# Patient Record
Sex: Male | Born: 1970 | Race: White | Hispanic: No | Marital: Married | Smoking: Current every day smoker
Health system: Southern US, Community
[De-identification: ages and names within clinical notes are randomized; demographics above are authoritative.]

## PROBLEM LIST (undated history)

## (undated) DIAGNOSIS — I10 Essential (primary) hypertension: Secondary | ICD-10-CM

## (undated) DIAGNOSIS — E119 Type 2 diabetes mellitus without complications: Secondary | ICD-10-CM

## (undated) DIAGNOSIS — E785 Hyperlipidemia, unspecified: Secondary | ICD-10-CM

## (undated) HISTORY — PX: CHOLECYSTECTOMY: SHX55

---

## 2004-09-22 ENCOUNTER — Emergency Department: Payer: Self-pay | Admitting: Emergency Medicine

## 2005-09-16 ENCOUNTER — Emergency Department: Payer: Self-pay | Admitting: Unknown Physician Specialty

## 2005-09-20 ENCOUNTER — Inpatient Hospital Stay: Payer: Self-pay | Admitting: Internal Medicine

## 2006-11-10 ENCOUNTER — Other Ambulatory Visit: Payer: Self-pay

## 2006-11-10 ENCOUNTER — Emergency Department: Payer: Self-pay | Admitting: Emergency Medicine

## 2009-02-18 ENCOUNTER — Emergency Department: Payer: Self-pay | Admitting: Emergency Medicine

## 2009-11-30 ENCOUNTER — Emergency Department: Payer: Self-pay | Admitting: Emergency Medicine

## 2009-12-12 ENCOUNTER — Emergency Department: Payer: Self-pay | Admitting: Emergency Medicine

## 2010-09-28 ENCOUNTER — Emergency Department: Payer: Self-pay | Admitting: Emergency Medicine

## 2012-04-02 ENCOUNTER — Emergency Department: Payer: Self-pay | Admitting: Emergency Medicine

## 2012-04-02 LAB — BASIC METABOLIC PANEL
Calcium, Total: 9.2 mg/dL (ref 8.5–10.1)
Creatinine: 1.31 mg/dL — ABNORMAL HIGH (ref 0.60–1.30)
EGFR (African American): >60
EGFR (Non-African Amer.): >60
Glucose: 133 mg/dL — ABNORMAL HIGH (ref 65–99)
Osmolality: 281 (ref 275–301)
Potassium: 4.3 mmol/L (ref 3.5–5.1)
Sodium: 140 mmol/L (ref 136–145)

## 2012-04-02 LAB — CK TOTAL AND CKMB (NOT AT ARMC)
CK, Total: 103 U/L (ref 35–232)
CK-MB: 0.8 ng/mL (ref 0.5–3.6)

## 2012-04-02 LAB — CBC
MCH: 31.5 pg (ref 26.0–34.0)
MCHC: 34.4 g/dL (ref 32.0–36.0)
MCV: 91 fL (ref 80–100)
Platelet: 271 10*3/uL (ref 150–440)
RBC: 5.26 10*6/uL (ref 4.40–5.90)

## 2012-04-02 LAB — TROPONIN I: Troponin-I: <0.02 ng/mL

## 2012-06-10 ENCOUNTER — Emergency Department: Payer: Self-pay | Admitting: *Deleted

## 2012-06-10 LAB — TROPONIN I: Troponin-I: <0.02 ng/mL

## 2012-06-10 LAB — COMPREHENSIVE METABOLIC PANEL
Albumin: 3.4 g/dL (ref 3.4–5.0)
Alkaline Phosphatase: 62 U/L (ref 50–136)
Calcium, Total: 9.1 mg/dL (ref 8.5–10.1)
EGFR (African American): >60
Glucose: 115 mg/dL — ABNORMAL HIGH (ref 65–99)
SGOT(AST): 27 U/L (ref 15–37)
SGPT (ALT): 26 U/L (ref 12–78)

## 2012-06-10 LAB — CBC
HGB: 16.9 g/dL (ref 13.0–18.0)
MCHC: 33.8 g/dL (ref 32.0–36.0)
Platelet: 226 10*3/uL (ref 150–440)
RBC: 5.39 10*6/uL (ref 4.40–5.90)

## 2012-06-10 LAB — CK TOTAL AND CKMB (NOT AT ARMC): CK, Total: 243 U/L — ABNORMAL HIGH (ref 35–232)

## 2013-02-02 ENCOUNTER — Emergency Department: Payer: Self-pay | Admitting: Emergency Medicine

## 2013-02-02 LAB — CBC
HGB: 16.8 g/dL (ref 13.0–18.0)
MCH: 32.2 pg (ref 26.0–34.0)
MCHC: 34.3 g/dL (ref 32.0–36.0)
MCV: 94 fL (ref 80–100)
RBC: 5.24 10*6/uL (ref 4.40–5.90)
RDW: 14 % (ref 11.5–14.5)
WBC: 8.7 10*3/uL (ref 3.8–10.6)

## 2013-02-02 LAB — COMPREHENSIVE METABOLIC PANEL
Anion Gap: 8 (ref 7–16)
BUN: 5 mg/dL — ABNORMAL LOW (ref 7–18)
Calcium, Total: 8.5 mg/dL (ref 8.5–10.1)
Chloride: 109 mmol/L — ABNORMAL HIGH (ref 98–107)
Co2: 28 mmol/L (ref 21–32)
Creatinine: 1.2 mg/dL (ref 0.60–1.30)
EGFR (African American): >60
Osmolality: 288 (ref 275–301)
Potassium: 3.6 mmol/L (ref 3.5–5.1)
SGOT(AST): 125 U/L — ABNORMAL HIGH (ref 15–37)
SGPT (ALT): 194 U/L — ABNORMAL HIGH (ref 12–78)
Sodium: 145 mmol/L (ref 136–145)
Total Protein: 6.8 g/dL (ref 6.4–8.2)

## 2013-02-02 LAB — LIPASE, BLOOD: Lipase: 203 U/L (ref 73–393)

## 2013-07-12 ENCOUNTER — Emergency Department: Payer: Self-pay | Admitting: Internal Medicine

## 2013-07-12 LAB — COMPREHENSIVE METABOLIC PANEL
Albumin: 3.6 g/dL (ref 3.4–5.0)
Alkaline Phosphatase: 59 U/L
Anion Gap: 4 — ABNORMAL LOW (ref 7–16)
Co2: 28 mmol/L (ref 21–32)
EGFR (African American): >60
EGFR (Non-African Amer.): >60
Osmolality: 278 (ref 275–301)
SGOT(AST): 34 U/L (ref 15–37)

## 2013-07-12 LAB — CK TOTAL AND CKMB (NOT AT ARMC)
CK, Total: 97 U/L (ref 35–232)
CK-MB: 0.7 ng/mL (ref 0.5–3.6)

## 2013-07-12 LAB — DRUG SCREEN, URINE
Amphetamines, Ur Screen: NEGATIVE (ref ?–1000)
Cannabinoid 50 Ng, Ur ~~LOC~~: NEGATIVE (ref ?–50)

## 2013-07-12 LAB — URINALYSIS, COMPLETE
Glucose,UR: NEGATIVE mg/dL (ref 0–75)
Leukocyte Esterase: NEGATIVE
Ph: 5 (ref 4.5–8.0)
Protein: NEGATIVE
RBC,UR: 2 /HPF (ref 0–5)
Squamous Epithelial: 1
WBC UR: 4 /HPF (ref 0–5)

## 2013-07-12 LAB — CBC
HCT: 50.1 % (ref 40.0–52.0)
MCV: 94 fL (ref 80–100)

## 2013-09-14 ENCOUNTER — Inpatient Hospital Stay: Payer: Self-pay | Admitting: Surgery

## 2013-09-14 LAB — CBC WITH DIFFERENTIAL/PLATELET
Basophil #: 0.2 10*3/uL — ABNORMAL HIGH (ref 0.0–0.1)
Basophil %: 2.4 %
EOS ABS: 0.1 10*3/uL (ref 0.0–0.7)
Eosinophil %: 1.1 %
HCT: 46 % (ref 40.0–52.0)
HGB: 15.6 g/dL (ref 13.0–18.0)
LYMPHS ABS: 3 10*3/uL (ref 1.0–3.6)
Lymphocyte %: 39.7 %
MCH: 32 pg (ref 26.0–34.0)
MCHC: 33.9 g/dL (ref 32.0–36.0)
MCV: 94 fL (ref 80–100)
MONO ABS: 0.6 x10 3/mm (ref 0.2–1.0)
MONOS PCT: 7.9 %
NEUTROS ABS: 3.7 10*3/uL (ref 1.4–6.5)
Neutrophil %: 48.9 %
Platelet: 220 10*3/uL (ref 150–440)
RBC: 4.88 10*6/uL (ref 4.40–5.90)
RDW: 13.5 % (ref 11.5–14.5)
WBC: 7.6 10*3/uL (ref 3.8–10.6)

## 2013-09-14 LAB — COMPREHENSIVE METABOLIC PANEL WITH GFR
Albumin: 3.2 g/dL — ABNORMAL LOW
Alkaline Phosphatase: 55 U/L
Anion Gap: 4 — ABNORMAL LOW
BUN: 9 mg/dL
Bilirubin,Total: 0.4 mg/dL
Calcium, Total: 9.2 mg/dL
Chloride: 106 mmol/L
Co2: 30 mmol/L
Creatinine: 1.13 mg/dL
EGFR (African American): >60
EGFR (Non-African Amer.): >60
Glucose: 108 mg/dL — ABNORMAL HIGH
Osmolality: 279
Potassium: 4 mmol/L
SGOT(AST): 62 U/L — ABNORMAL HIGH
SGPT (ALT): 63 U/L
Sodium: 140 mmol/L
Total Protein: 7.1 g/dL

## 2013-09-14 LAB — URINALYSIS, COMPLETE
BLOOD: NEGATIVE
Bacteria: NONE SEEN
Bilirubin,UR: NEGATIVE
Glucose,UR: NEGATIVE mg/dL (ref 0–75)
Ketone: NEGATIVE
NITRITE: NEGATIVE
Ph: 7 (ref 4.5–8.0)
Protein: NEGATIVE
RBC,UR: <1 /HPF (ref 0–5)
Specific Gravity: 1.016 (ref 1.003–1.030)
Squamous Epithelial: <1
WBC UR: 10 /HPF (ref 0–5)

## 2013-09-14 LAB — LIPASE, BLOOD: LIPASE: 204 U/L (ref 73–393)

## 2013-09-15 LAB — CBC WITH DIFFERENTIAL/PLATELET
BASOS PCT: 0.3 %
Basophil #: 0 10*3/uL (ref 0.0–0.1)
EOS ABS: 0 10*3/uL (ref 0.0–0.7)
EOS PCT: 0.1 %
HCT: 42.3 % (ref 40.0–52.0)
HGB: 14.1 g/dL (ref 13.0–18.0)
LYMPHS PCT: 11.9 %
Lymphocyte #: 1.7 10*3/uL (ref 1.0–3.6)
MCH: 31.2 pg (ref 26.0–34.0)
MCHC: 33.4 g/dL (ref 32.0–36.0)
MCV: 94 fL (ref 80–100)
MONO ABS: 0.7 x10 3/mm (ref 0.2–1.0)
Monocyte %: 5.1 %
Neutrophil #: 12.1 10*3/uL — ABNORMAL HIGH (ref 1.4–6.5)
Neutrophil %: 82.6 %
Platelet: 212 10*3/uL (ref 150–440)
RBC: 4.52 10*6/uL (ref 4.40–5.90)
RDW: 13.4 % (ref 11.5–14.5)
WBC: 14.6 10*3/uL — ABNORMAL HIGH (ref 3.8–10.6)

## 2013-09-15 LAB — COMPREHENSIVE METABOLIC PANEL
ALBUMIN: 2.7 g/dL — AB (ref 3.4–5.0)
ALK PHOS: 46 U/L
AST: 49 U/L — AB (ref 15–37)
Anion Gap: 4 — ABNORMAL LOW (ref 7–16)
BILIRUBIN TOTAL: 0.7 mg/dL (ref 0.2–1.0)
BUN: 15 mg/dL (ref 7–18)
CALCIUM: 8.5 mg/dL (ref 8.5–10.1)
CHLORIDE: 104 mmol/L (ref 98–107)
Co2: 27 mmol/L (ref 21–32)
Creatinine: 1.12 mg/dL (ref 0.60–1.30)
EGFR (African American): >60
EGFR (Non-African Amer.): >60
Glucose: 135 mg/dL — ABNORMAL HIGH (ref 65–99)
Osmolality: 273 (ref 275–301)
POTASSIUM: 4.2 mmol/L (ref 3.5–5.1)
SGPT (ALT): 53 U/L (ref 12–78)
Sodium: 135 mmol/L — ABNORMAL LOW (ref 136–145)
TOTAL PROTEIN: 6.1 g/dL — AB (ref 6.4–8.2)

## 2013-09-17 LAB — PATHOLOGY REPORT

## 2014-11-02 ENCOUNTER — Emergency Department: Payer: Self-pay | Admitting: Emergency Medicine

## 2014-12-04 NOTE — Consult Note (Signed)
PATIENT NAME:  Brian Conner, CADDELL MR#:  [id number] DATE OF BIRTH:  22-Nov-1970  SURGICAL CONSULATION REPORT  DATE OF CONSULTATION:  02/02/2013  ATTENDING PHYSICIAN:  Cristal Deer A. Zeya Balles, MD  REASON FOR CONSULTATION: Epigastric pain x 1 day, much-improved.  HISTORY OF PRESENT ILLNESS: Mr. Budney is a pleasant 44 year old who drinks approximately 1.5 liters of wine per day who presents with epigastric pain. He says that it began late last night, and he took an ambulance to the ED. It has since improved. He say he has had  2 small episodes previously. Also has problems, he says, when he eats all the time. He says it doesn't go down very well. He is unable to be comfortable, per his reports. No fevers, chills, night sweats, shortness of breath, cough, chest pain, diarrhea, constipation, dysuria or hematuria. Says that he also has a history of severe reflux when he eats.    PAST MEDICAL HISTORY: 1.  History of gallstones.  2.  History of gastroesophageal reflux.  3.  Alcohol abuse.  4.  Tobacco abuse.   ALLERGIES: No known drug allergies.   HOME MEDICATIONS: No known home meds.   FAMILY HISTORY: Noncontributory.   SOCIAL HISTORY: Drinks a 1.5 liters per day. Can usually go for 2 days without drinking, but then needs to drink. Says he does not have withdrawals. Is a smoker and has a history of alcohol and marijuana use, but denies any IV drug use.   REVIEW OF SYSTEMS: A 10-point review of systems was obtained. Pertinent positives and negatives as above.   PHYSICAL EXAMINATION: VITAL SIGNS: Temp 97.5, pulse 64, blood pressure 123/85, respirations 20, 97% on room air.   GENERAL: No acute distress. Alert and oriented x 3. Appears jittery and anxious.  HEAD: Normocephalic, atraumatic.  EYES: No scleral icterus. No conjunctivitis.  FACE: No obvious facial trauma. Normal external nose. Normal external ears.  CHEST: Lungs clear to auscultation. Moving air well.  HEART: Regular rate and rhythm. No  murmurs, rubs, or gallops.  ABDOMEN: Soft, nontender, nondistended.  EXTREMITIES: Moves all extremities well. Strength 5/5.  NEUROLOGIC: Cranial nerves II-XII grossly intact. Neurologically intact.   LABORATORY DATA: Are as follows: Significant for a normal white cell count, normal hemoglobin and hematocrit, platelet count.   LFTs were: Albumin is 3.2, AST is 125, ALT is 194. ENT is normal. Lipase 203.   Ultrasound shows normal gallbladder wall thickening. No pericholecystic fluid. Does have a  2 mm gallstone at the neck of the gallbladder.   ASSESSMENT AND PLAN: Mr. Busby is a pleasant 44 year old male with a history of recurrent epigastric pain and gallstones. He is relatively nontender on exam. His ultrasound is relatively unremarkable. His LFTs are relatively elevated, consistent with his history of alcohol use.  Differential includes reflux, peptic ulcer disease and gallstones, symptomatic cholelithiasis.  I believe his alcohol is contributing to a large amount of his complaints including the bloated feeling and the reflux. I told him that I will continue to follow him and evaluate him for gallbladder disease, but would try a PPI to begin, and treat presumptively for peptic ulcer disease. I told him that he must count down severely on drinking before I would consider a cholecystectomy if I believed it was warranted.  I explained this to him, and he agrees with this plan.     ____________________________ Si Raider. Chevelle Coulson, MD cal:dm D: 02/02/2013 08:49:16 ET T: 02/02/2013 09:28:54 ET JOB#: [id number]  cc: Cristal Deer A. Maciah Schweigert, MD, <Dictator> Kateline Kinkade A Bronnie Vasseur  MD ELECTRONICALLY SIGNED 02/10/2013 19:29

## 2014-12-05 NOTE — Op Note (Signed)
PATIENT NAME:  Brian Conner, Brian Conner MR#:  [id number] DATE OF BIRTH:  11-27-1970  DATE OF PROCEDURE:  09/14/2013  PREOPERATIVE DIAGNOSES:  Biliary colic, acute cholecystitis.   POSTOPERATIVE DIAGNOSES: Biliary colic, acute cholecystitis.   PROCEDURE: Laparoscopic cholecystectomy.   SURGEON: Quentin Orealph L. Ely III, MD  ANESTHESIA: General.   OPERATIVE PROCEDURE: With the patient in the supine position after induction of appropriate general anesthesia, the patient's abdomen was prepped with Betadine and draped in sterile towels. The patient was placed in the head down, feet up position.   A small infraumbilical incision was made in the standard fashion, carried down bluntly through the subcutaneous tissue. A Veress needle was used to cannulate the peritoneal cavity. CO2 was insufflated to appropriate pressure measurements. When approximately 2.5 liters of CO2 were instilled, the Veress needle was withdrawn. An 11 mm [employer] port was inserted in the peritoneal cavity. Intraperitoneal position was confirmed. CO2 was reinsufflated. The patient was placed in the head up, feet down position and rotated slightly to the left side. A subxiphoid transverse incision was made and an 11 mm port was inserted under direct vision. Two lateral ports, 5 mm in size, were inserted under direct vision.   The gallbladder appeared discolored, mildly distended and thickened. There was some mild edema. The gallbladder was retracted superiorly and laterally, exposing the hepatoduodenal ligament. The cystic artery and cystic duct were identified. The cystic duct did not appear to be of adequate length of support cholangiography and the patient's ultrasound did demonstrate just 1  large impacted stone. The cystic duct was doubly clipped and divided. The cystic artery was doubly clipped and divided. The gallbladder was then dissected free of its bed with the hook and cautery apparatus. The gallbladder was then captured in an Endo Catch  apparatus and removed through the subxiphoid incision. The incision had to be enlarged in order to accommodate the very large gallstone. The bed of the liver had been oozing at the time of the initial dissection. That area was checked and no significant bleeding was encountered. However, because of the inflammatory change, a Jackson-Pratt drain was inserted through the subxiphoid port and brought out through 1 of the lateral ports, being positioned in the bed of the liver.   The upper incision and the midline was closed with figure-of-eight sutures of 0 Vicryl. The abdomen was then copiously suction irrigated with 2 liters of warm saline solution. The abdomen was desufflated. All ports were withdrawn without difficulty. Skin incisions were closed with a combination of 5-0 and 3-0 nylon. The drain was secured with 3-0 nylon. The area was infiltrated with 0.25% Marcaine for postoperative pain control. Sterile dressings were applied. The patient was returned to the recovery room, having tolerated the procedure well. Sponge, instrument and needle counts were correct x 2 in the Operating Room.   ____________________________ Quentin Orealph L. Ely III, MD rle:cs D: 09/14/2013 12:26:34 ET T: 09/14/2013 19:40:58 ET JOB#: [id number]  cc: Quentin Orealph L. Ely III, MD, <Dictator> Quentin OreALPH L ELY MD ELECTRONICALLY SIGNED 09/22/2013 22:54

## 2014-12-05 NOTE — H&P (Signed)
Subjective/Chief Complaint RUQ pain   History of Present Illness 3 days ruq [pain has had occ pain for one year, mult ED visits no w with vomiting and unrelenting pain, some back pain no jaundic nml BM   Past History PMH none PSH left ring finger fx   Past Med/Surgical Hx:  Gallstones:   GERD - Esophageal Reflux:   Alcohol Abuse:   IV drug abuse:   smoker:   denies:   ALLERGIES:  No Known Allergies:   Family and Social History:  Family History Non-Contributory   Social History positive  tobacco, positive ETOH, occ tree work   + Tobacco Current (within 1 year)   Place of Living Home   Review of Systems:  Fever/Chills No   Cough No   Abdominal Pain Yes   Diarrhea No   Constipation No   Nausea/Vomiting Yes   SOB/DOE No   Chest Pain No   Dysuria No   Tolerating Diet No  Nauseated  Vomiting   Medications/Allergies Reviewed Medications/Allergies reviewed   Physical Exam:  GEN uncomfortable   HEENT pink conjunctivae   NECK supple   RESP normal resp effort  clear BS   CARD regular rate   ABD positive tenderness  soft  distended  pos Murphy's sign   LYMPH negative neck   EXTR negative edema   SKIN normal to palpation   PSYCH alert, A+O to time, place, person, good insight   Lab Results: Hepatic:  01-Feb-15 01:24   Bilirubin, Total 0.4  Alkaline Phosphatase 55 (45-117 NOTE: New Reference Range 07/04/13)  SGPT (ALT) 63  SGOT (AST)  62  Total Protein, Serum 7.1  Albumin, Serum  3.2  Routine Chem:  01-Feb-15 01:24   Lipase 204 (Result(s) reported on 14 Sep 2013 at 02:37AM.)  Glucose, Serum  108  BUN 9  Creatinine (comp) 1.13  Sodium, Serum 140  Potassium, Serum 4.0  Chloride, Serum 106  CO2, Serum 30  Calcium (Total), Serum 9.2  Osmolality (calc) 279  eGFR (African American) >60  eGFR (Non-African American) >60 (eGFR values <54m/min/1.73 m2 may be an indication of chronic kidney disease (CKD). Calculated eGFR is useful in  patients with stable renal function. The eGFR calculation will not be reliable in acutely ill patients when serum creatinine is changing rapidly. It is not useful in  patients on dialysis. The eGFR calculation may not be applicable to patients at the low and high extremes of body sizes, pregnant women, and vegetarians.)  Anion Gap  4  Routine Hem:  01-Feb-15 01:24   WBC (CBC) 7.6  RBC (CBC) 4.88  Hemoglobin (CBC) 15.6  Hematocrit (CBC) 46.0  Platelet Count (CBC) 220  MCV 94  MCH 32.0  MCHC 33.9  RDW 13.5  Neutrophil % 48.9  Lymphocyte % 39.7  Monocyte % 7.9  Eosinophil % 1.1  Basophil % 2.4  Neutrophil # 3.7  Lymphocyte # 3.0  Monocyte # 0.6  Eosinophil # 0.1  Basophil #  0.2 (Result(s) reported on 14 Sep 2013 at 02:08AM.)   Radiology Results: [place]    29-Nov-14 15:52, [place]  [place]  REASON FOR EXAM:    abd pain  COMMENTS:   Body Site: GB and Fossa, CBD    PROCEDURE: [place] - [place]  - Jul 12 2013  3:52PM     CLINICAL DATA:  Abdominal pain    EXAM:  [place] - RIGHT UPPER QUADRANT  COMPARISON:  None.    FINDINGS:  Gallbladder  Cholelithiasis, measuring up to 2.5 cm. No gallbladder wall  thickening or pericholecystic fluid. Negative sonographic Murphy's  sign.    Common bile duct    Diameter: 6 mm, at the upper limits of normal.    Liver:    No focal lesion identified. Within normal limits in parenchymal  echogenicity.     IMPRESSION:  Cholelithiasis, without associated findings to suggest acute  cholecystitis.      Electronically Signed    By: Julian Hy M.D.    On: 07/12/2013 16:11         Verified By: Julian Hy, M.D.,    01-Feb-15 02:36, [place] Abdomen Limited Survey  [place] Abdomen Limited Survey  REASON FOR EXAM:    RUQ pain  COMMENTS:       PROCEDURE: [place]  - [place] ABDOMEN LIMITED SURVEY  - Sep 14 2013  2:36AM     CLINICAL DATA:  Right upper quadrant  pain.    EXAM:  US ABDOMEN LIMITED - RIGHT UPPER QUADRANT    COMPARISON:  07/12/2013    FINDINGS:  Gallbladder:  There is a large shadowing gallstone which is not mobile. The  gallbladder wall is thickened to 5 mm. There is report of focal  gallbladder tenderness, but the gallbladder is not distended to  suggest cystic duct obstruction.    Commonbile duct:    Diameter: 6 mm, stable from prior.    Liver:    No focal lesion identified. Within normal limits in parenchymal  echogenicity.     IMPRESSION:  Impacted gallstone in the gallbladder neck. There is gallbladder  wall thickening and focal gallbladder tenderness, but no gallbladder  distention to suggest cystic duct obstruction.      Electronically Signed    By: Jorje Guild M.D.    On: 09/14/2013 02:37         Verified By: Gilford Silvius, M.D.,    Assessment/Admission Diagnosis ac cholecystitis rec admit hydrate control vomiting plan lap chole, recheck LFT discussed plans, options rationale and risks of bl inf rec sx open procedure bile duct damage leak ERCP ret stone. agrees with plan   Electronic Signatures: Florene Glen (MD)  (Signed 01-Feb-15 06:31)  Authored: CHIEF COMPLAINT and HISTORY, PAST MEDICAL/SURGIAL HISTORY, ALLERGIES, FAMILY AND SOCIAL HISTORY, REVIEW OF SYSTEMS, PHYSICAL EXAM, LABS, Radiology, ASSESSMENT AND PLAN   Last Updated: 01-Feb-15 06:31 by Florene Glen (MD)

## 2014-12-05 NOTE — H&P (Signed)
PATIENT NAME:  Brian Conner, Brian Conner MR#:  [id number] DATE OF BIRTH:  Jun 06, 1971  DATE OF ADMISSION:  09/14/2013  CHIEF COMPLAINT:  Right upper quadrant pain.   HISTORY OF PRESENT ILLNESS:  This is a patient with known gallstones.  He has had multiple episodes over the last year and in fact has been to the Emergency Room multiple times, but because he lacks signs of acute cholecystitis he was discharged.  Over the last three days he has had increasing abdominal pain, which is now unrelenting.  He has had multiple emesis, nausea and vomiting over the last three days.  He has been unable to eat.  He denies fevers or chills.  Denies melena or hematochezia or acholic stools and no jaundice.  He is starting to have some low back pain, however.   PAST MEDICAL HISTORY:  None.   PAST SURGICAL HISTORY:  Left ring finger fracture repair.   FAMILY HISTORY:  Noncontributory.   SOCIAL HISTORY:  The patient smokes tobacco, drinks alcohol, not to excess and is an occasional Geophysicist/field seismologisttree worker.   REVIEW OF SYSTEMS:  A 10 system review has been performed and negative with the exception of that mentioned in the HPI.   MEDICATIONS:  None.   ALLERGIES:  None.   PHYSICAL EXAMINATION: GENERAL:  Somewhat uncomfortable-appearing male patient.  VITAL SIGNS:  No vital signs are available in the chart at this point, but his heart rate appeared to be approximately 70 on my exam.  HEENT:  No scleral icterus.  NECK:  No palpable neck nodes.  CHEST:  Clear to auscultation.  CARDIAC:  Regular rate and rhythm.  ABDOMEN:  Soft.  There is tenderness in the right upper quadrant with a positive Murphy's sign.  No scars are noted, but mildly obese.  EXTREMITIES:  Without edema.  NEUROLOGIC:  Grossly intact.  INTEGUMENT:  No jaundice.   LABORATORY DATA:  White blood cell count 7.6, hemoglobin and hematocrit of 15.6 and 46 with a platelet count of 220.  Electrolytes are within normal limits.  AST is slightly elevated at 62 with a normal  ALT and normal bilirubin.    Ultrasound shows multiple stones, a large stone impacted in the infundibulum of the gallbladder with gallbladder wall thickening, but no distention.  He has had other ultrasounds showing stones with similar findings as well.   ASSESSMENT:  This is a patient with symptomatic cholelithiasis and likely acute cholecystitis.  His elevated liver function tests are present, only mildly so.   PLAN:  Would be to admit the patient to the hospital, start antibiotics, hydrate him and recheck his LFTs.  If they do not increase then laparoscopic cholecystectomy with cholangiography could be performed in the next day or so.  I discussed with he and his wife these options and the rationale for such.  The risks of bleeding, infection, recurrence of symptoms, open procedure, bile duct damage, bile duct leak, retained common bile duct stone, any of which could require further surgery and/or ERCP, stent, and papillotomy were all discussed with him.  He understood and agreed to proceed.     ____________________________ Adah Salvageichard E. Excell Seltzerooper, MD rec:ea D: 09/14/2013 06:35:07 ET T: 09/14/2013 06:58:40 ET JOB#: [id number]  cc: Adah Salvageichard E. Excell Seltzerooper, MD, <Dictator> Lattie HawICHARD E Leone Putman MD ELECTRONICALLY SIGNED 09/14/2013 19:37

## 2015-12-25 ENCOUNTER — Encounter: Payer: Self-pay | Admitting: Emergency Medicine

## 2015-12-25 ENCOUNTER — Emergency Department
Admission: EM | Admit: 2015-12-25 | Discharge: 2015-12-25 | Disposition: A | Discharge status: Home / Self Care | Payer: Self-pay | Attending: Emergency Medicine | Admitting: Emergency Medicine

## 2015-12-25 DIAGNOSIS — Y9389 Activity, other specified: Secondary | ICD-10-CM | POA: Insufficient documentation

## 2015-12-25 DIAGNOSIS — Y929 Unspecified place or not applicable: Secondary | ICD-10-CM | POA: Insufficient documentation

## 2015-12-25 DIAGNOSIS — T148XXA Other injury of unspecified body region, initial encounter: Secondary | ICD-10-CM

## 2015-12-25 DIAGNOSIS — S51812A Laceration without foreign body of left forearm, initial encounter: Secondary | ICD-10-CM | POA: Insufficient documentation

## 2015-12-25 DIAGNOSIS — W268XXA Contact with other sharp object(s), not elsewhere classified, initial encounter: Secondary | ICD-10-CM | POA: Insufficient documentation

## 2015-12-25 DIAGNOSIS — Y999 Unspecified external cause status: Secondary | ICD-10-CM | POA: Insufficient documentation

## 2015-12-25 DIAGNOSIS — Z23 Encounter for immunization: Secondary | ICD-10-CM | POA: Insufficient documentation

## 2015-12-25 DIAGNOSIS — F17210 Nicotine dependence, cigarettes, uncomplicated: Secondary | ICD-10-CM | POA: Insufficient documentation

## 2015-12-25 MED ORDER — LIDOCAINE-EPINEPHRINE (PF) 1 %-1:200000 IJ SOLN: 30.0000 mL | Freq: Once | INTRAMUSCULAR | Status: DC

## 2015-12-25 MED ORDER — TETANUS-DIPHTH-ACELL PERTUSSIS 5-2.5-18.5 LF-MCG/0.5 IM SUSP
0.5000 mL | Freq: Once | INTRAMUSCULAR | Status: AC
  Administered 2015-12-25: 0.5 mL via INTRAMUSCULAR

## 2015-12-25 MED ORDER — TETANUS-DIPHTH-ACELL PERTUSSIS 5-2.5-18.5 LF-MCG/0.5 IM SUSP
INTRAMUSCULAR | Status: AC
  Filled 2015-12-25: qty 0.5

## 2015-12-25 MED ORDER — HYDROCODONE-ACETAMINOPHEN 5-325 MG PO TABS: 1.0000 | ORAL_TABLET | ORAL | Status: DC | PRN

## 2015-12-25 MED ORDER — LIDOCAINE-EPINEPHRINE (PF) 1 %-1:200000 IJ SOLN
INTRAMUSCULAR | Status: AC
  Filled 2015-12-25: qty 30

## 2015-12-25 NOTE — Discharge Instructions (Signed)
Laceration Care, Adult  A laceration is a cut that goes through all layers of the skin. The cut also goes into the tissue that is right under the skin. Some cuts heal on their own. Others need to be closed with stitches (sutures), staples, skin adhesive strips, or wound glue. Taking care of your cut lowers your risk of infection and helps your cut to heal better.  HOW TO TAKE CARE OF YOUR CUT  For stitches or staples:  · Keep the wound clean and dry.  · If you were given a bandage (dressing), you should change it at least one time per day or as told by your doctor. You should also change it if it gets wet or dirty.  · Keep the wound completely dry for the first 24 hours or as told by your doctor. After that time, you may take a shower or a bath. However, make sure that the wound is not soaked in water until after the stitches or staples have been removed.  · Clean the wound one time each day or as told by your doctor:    Wash the wound with soap and water.    Rinse the wound with water until all of the soap comes off.    Pat the wound dry with a clean towel. Do not rub the wound.  · After you clean the wound, put a thin layer of antibiotic ointment on it as told by your doctor. This ointment:    Helps to prevent infection.    Keeps the bandage from sticking to the wound.  · Have your stitches or staples removed as told by your doctor.  If your doctor used skin adhesive strips:   · Keep the wound clean and dry.  · If you were given a bandage, you should change it at least one time per day or as told by your doctor. You should also change it if it gets dirty or wet.  · Do not get the skin adhesive strips wet. You can take a shower or a bath, but be careful to keep the wound dry.  · If the wound gets wet, pat it dry with a clean towel. Do not rub the wound.  · Skin adhesive strips fall off on their own. You can trim the strips as the wound heals. Do not remove any strips that are still stuck to the wound. They will  fall off after a while.  If your doctor used wound glue:  · Try to keep your wound dry, but you may briefly wet it in the shower or bath. Do not soak the wound in water, such as by swimming.  · After you take a shower or a bath, gently pat the wound dry with a clean towel. Do not rub the wound.  · Do not do any activities that will make you really sweaty until the skin glue has fallen off on its own.  · Do not apply liquid, cream, or ointment medicine to your wound while the skin glue is still on.  · If you were given a bandage, you should change it at least one time per day or as told by your doctor. You should also change it if it gets dirty or wet.  · If a bandage is placed over the wound, do not let the tape for the bandage touch the skin glue.  · Do not pick at the glue. The skin glue usually stays on for 5-10 days. Then, it   falls off of the skin.  General Instructions   · To help prevent scarring, make sure to cover your wound with sunscreen whenever you are outside after stitches are removed, after adhesive strips are removed, or when wound glue stays in place and the wound is healed. Make sure to wear a sunscreen of at least 30 SPF.  · Take over-the-counter and prescription medicines only as told by your doctor.  · If you were given antibiotic medicine or ointment, take or apply it as told by your doctor. Do not stop using the antibiotic even if your wound is getting better.  · Do not scratch or pick at the wound.  · Keep all follow-up visits as told by your doctor. This is important.  · Check your wound every day for signs of infection. Watch for:    Redness, swelling, or pain.    Fluid, blood, or pus.  · Raise (elevate) the injured area above the level of your heart while you are sitting or lying down, if possible.  GET HELP IF:  · You got a tetanus shot and you have any of these problems at the injection site:    Swelling.    Very bad pain.    Redness.    Bleeding.  · You have a fever.  · A wound that was  closed breaks open.  · You notice a bad smell coming from your wound or your bandage.  · You notice something coming out of the wound, such as wood or glass.  · Medicine does not help your pain.  · You have more redness, swelling, or pain at the site of your wound.  · You have fluid, blood, or pus coming from your wound.  · You notice a change in the color of your skin near your wound.  · You need to change the bandage often because fluid, blood, or pus is coming from the wound.  · You start to have a new rash.  · You start to have numbness around the wound.  GET HELP RIGHT AWAY IF:  · You have very bad swelling around the wound.  · Your pain suddenly gets worse and is very bad.  · You notice painful lumps near the wound or on skin that is anywhere on your body.  · You have a red streak going away from your wound.  · The wound is on your hand or foot and you cannot move a finger or toe like you usually can.  · The wound is on your hand or foot and you notice that your fingers or toes look pale or bluish.     This information is not intended to replace advice given to you by your health care provider. Make sure you discuss any questions you have with your health care provider.     Document Released: 01/17/2008 Document Revised: 12/15/2014 Document Reviewed: 07/27/2014  [employer]vier Interactive Patient Education ©2016 [employer]

## 2015-12-25 NOTE — ED Notes (Addendum)
Pt was clearing brush from the back of his truck and incurred a laceration to his left forearm; bleeding controlled; full ROM to extremity

## 2015-12-25 NOTE — ED Provider Notes (Signed)
[facility] Emergency Department Provider Note  ____________________________________________  Time seen: Approximately 8:33 PM  I have reviewed the triage vital signs and the nursing notes.   HISTORY  Chief Complaint Extremity Laceration   HPI Brian Conner is a 45 y.o. male who presents to the emergency department for evaluation of a laceration. He states he was clearing brush from the back of his truck and was cut by a sharp branch. Tetanus shot is not up-to-date. Bleeding is well-controlled.  History reviewed. No pertinent past medical history.  There are no active problems to display for this patient.   Past Surgical History  Procedure Laterality Date  . Cholecystectomy      Current Outpatient Rx  Name  Route  Sig  Dispense  Refill  . HYDROcodone-acetaminophen (NORCO/VICODIN) 5-325 MG tablet   Oral   Take 1 tablet by mouth every 4 (four) hours as needed.   12 tablet   0     Allergies Ibuprofen and Tramadol  History reviewed. No pertinent family history.  Social History Social History  Substance Use Topics  . Smoking status: Current Every Day Smoker -- 15.00 packs/day    Types: Cigarettes  . Smokeless tobacco: None  . Alcohol Use: Yes     Comment: seldom    Review of Systems  Constitutional: Negative for fever/chills Respiratory: Negative for shortness of breath. Musculoskeletal: Negative for pain. Skin: Positive for laceration Neurological: Negative for headaches, focal weakness or numbness. ____________________________________________   PHYSICAL EXAM:  VITAL SIGNS: ED Triage Vitals  Enc Vitals Group     BP 12/25/15 1920 125/77 mmHg     Pulse Rate 12/25/15 1920 90     Resp 12/25/15 1920 20     Temp 12/25/15 1920 98.5 F (36.9 C)     Temp Source 12/25/15 1920 Oral     SpO2 12/25/15 1920 94 %     Weight 12/25/15 1920 280 lb (127.007 kg)     Height 12/25/15 1920  (1.803 m)     Head Cir --      Peak Flow --       Pain Score 12/25/15 1932 7     Pain Loc --      Pain Edu? --      Excl. in GC? --      Constitutional: Alert and oriented. Well appearing and in no acute distress. Eyes: Conjunctivae are normal. PERRL. EOMI. Nose: No congestion/rhinnorhea. Mouth/Throat: Mucous membranes are moist.   Neck: No stridor. Cardiovascular: Good peripheral circulation. Respiratory: Normal respiratory effort.  No retractions. Musculoskeletal: FROM throughout. Neurologic:  Normal speech and language. No gross focal neurologic deficits are appreciated. Skin:  4.5 cm laceration to the left forearm below the antecubital area with subcutaneous tissue involvement. ____________________________________________   LABS (all labs ordered are listed, but only abnormal results are displayed)  Labs Reviewed - No data to display ____________________________________________  EKG   ____________________________________________  RADIOLOGY  not indicated ____________________________________________   PROCEDURES  Procedure(s) performed:   LACERATION REPAIR Performed by: Kem Boroughs Authorized by: Kem Boroughs Consent: Verbal consent obtained. Risks and benefits: risks, benefits and alternatives were discussed Consent given by: patient Patient identity confirmed: provided demographic data Prepped and Draped in normal sterile fashion Wound explored  Laceration Location: Left forearm  Laceration Length: 4.5 cm  No Foreign Bodies seen or palpated  Anesthesia: local infiltration  Local anesthetic: lidocaine 1 % with epinephrine  Anesthetic total: 5 ml  Irrigation method: syringe  Amount of  cleaning: Copious   Skin closure: 4-0 Vicryl and 5-0 nylon   Number of sutures: 4-0 Vicryl running stitch/#8 5-0 nylon dermal   Technique: running stitch/simple interrupted   Patient tolerance: Patient tolerated the procedure well with no immediate  complications.  ____________________________________________   INITIAL IMPRESSION / ASSESSMENT AND PLAN / ED COURSE  Pertinent labs & imaging results that were available during my care of the patient were reviewed by me and considered in my medical decision making (see chart for details).  Patient will be advised to  apply antibiotic ointment 2 times per day.  He was advised to follow up with  urgent care for suture removal.  He was also advised to return to the emergency department for symptoms that change or worsen if unable to schedule an appointment.  ____________________________________________   FINAL CLINICAL IMPRESSION(S) / ED DIAGNOSES  Final diagnoses:  Contusion  Laceration of forearm without complication, left, initial encounter       Chinita PesterCari B Syretta Kochel, FNP 12/25/15 2041  Jene Everyobert Kinner, MD 12/25/15 2243

## 2016-01-07 ENCOUNTER — Encounter: Payer: Self-pay | Admitting: Emergency Medicine

## 2016-01-07 ENCOUNTER — Emergency Department
Admission: EM | Admit: 2016-01-07 | Discharge: 2016-01-07 | Disposition: A | Discharge status: Home / Self Care | Payer: Self-pay | Attending: Emergency Medicine | Admitting: Emergency Medicine

## 2016-01-07 DIAGNOSIS — Z4802 Encounter for removal of sutures: Secondary | ICD-10-CM | POA: Insufficient documentation

## 2016-01-07 DIAGNOSIS — F17210 Nicotine dependence, cigarettes, uncomplicated: Secondary | ICD-10-CM | POA: Insufficient documentation

## 2016-01-07 DIAGNOSIS — L03114 Cellulitis of left upper limb: Secondary | ICD-10-CM | POA: Insufficient documentation

## 2016-01-07 MED ORDER — CEPHALEXIN 500 MG PO CAPS: 500.0000 mg | ORAL_CAPSULE | Freq: Two times a day (BID) | ORAL | Status: AC

## 2016-01-07 NOTE — Discharge Instructions (Signed)
Cellulitis Cellulitis is an infection of the skin and the tissue beneath it. The infected area is usually red and tender. Cellulitis occurs most often in the arms and lower legs.  CAUSES  Cellulitis is caused by bacteria that enter the skin through cracks or cuts in the skin. The most common types of bacteria that cause cellulitis are staphylococci and streptococci. SIGNS AND SYMPTOMS   Redness and warmth.  Swelling.  Tenderness or pain.  Fever. DIAGNOSIS  Your health care provider can usually determine what is wrong based on a physical exam. Blood tests may also be done. TREATMENT  Treatment usually involves taking an antibiotic medicine. HOME CARE INSTRUCTIONS   Take your antibiotic medicine as directed by your health care provider. Finish the antibiotic even if you start to feel better.  Keep the infected arm or leg elevated to reduce swelling.  Apply a warm cloth to the affected area up to 4 times per day to relieve pain.  Take medicines only as directed by your health care provider.  Keep all follow-up visits as directed by your health care provider. SEEK MEDICAL CARE IF:   You notice red streaks coming from the infected area.  Your red area gets larger or turns dark in color.  Your bone or joint underneath the infected area becomes painful after the skin has healed.  Your infection returns in the same area or another area.  You notice a swollen bump in the infected area.  You develop new symptoms.  You have a fever. SEEK IMMEDIATE MEDICAL CARE IF:   You feel very sleepy.  You develop vomiting or diarrhea.  You have a general ill feeling (malaise) with muscle aches and pains.   This information is not intended to replace advice given to you by your health care provider. Make sure you discuss any questions you have with your health care provider.   Document Released: 05/10/2005 Document Revised: 04/21/2015 Document Reviewed: 10/16/2011 Elsevier Interactive  Patient Education 2016 Else[employer] Inc[employer]  Suture Removal, Care After Refer to this sheet in the next few weeks. These instructions provide you with information on caring for yourself after your procedure. Your health care provider may also give you more specific instructions. Your treatment has been planned according to current medical practices, but problems sometimes occur. Call your health care provider if you have any problems or questions after your procedure. WHAT TO EXPECT AFTER THE PROCEDURE After your stitches (sutures) are removed, it is typical to have the following:  Some discomfort and swelling in the wound area.  Slight redness in the area. HOME CARE INSTRUCTIONS   If you have skin adhesive strips over the wound area, do not take the strips off. They will fall off on their own in a few days. If the strips remain in place after 14 days, you may remove them.  Change any bandages (dressings) at least once a day or as directed by your health care provider. If the bandage sticks, soak it off with warm, soapy water.  Apply cream or ointment only as directed by your health care provider. If using cream or ointment, wash the area with soap and water 2 times a day to remove all the cream or ointment. Rinse off the soap and pat the area dry with a clean towel.  Keep the wound area dry and clean. If the bandage becomes wet or dirty, or if it develops a bad smell, change it as soon as possible.  Continue to protect the wound  from injury.  Use sunscreen when out in the sun. New scars become sunburned easily. SEEK MEDICAL CARE IF:  You have increasing redness, swelling, or pain in the wound.  You see pus coming from the wound.  You have a fever.  You notice a bad smell coming from the wound or dressing.  Your wound breaks open (edges not staying together).   This information is not intended to replace advice given to you by your health care provider. Make sure you discuss any  questions you have with your health care provider.   Document Released: 04/25/2001 Document Revised: 05/21/2013 Document Reviewed: 03/12/2013 Elsevier Interactive Patient Education [employer]

## 2016-01-07 NOTE — ED Provider Notes (Signed)
[facility] Emergency Department Provider Note  ____________________________________________  Time seen: 10:17 AM  I have reviewed the triage vital signs and the nursing notes.   HISTORY  Chief Complaint Suture / Staple Removal   HPI Brian Conner is a 45 y.o. male who presents to the emergency department for suture removal. Sutures were inserted here on 12/25/15 after he cut himself on a sharp tree branch while cleaning out his truck bed. He states he has been pouring alcohol on it to keep it clean. He states that a couple of days ago 2 stitches in the middle busted open. He states that he noticed some redness around the area a few days ago, but it has been getting better. No purulent drainage.   History reviewed. No pertinent past medical history.  There are no active problems to display for this patient.   Past Surgical History  Procedure Laterality Date  . Cholecystectomy      Current Outpatient Rx  Name  Route  Sig  Dispense  Refill  . cephALEXin (KEFLEX) 500 MG capsule   Oral   Take 1 capsule (500 mg total) by mouth 2 (two) times daily.   40 capsule   0   . HYDROcodone-acetaminophen (NORCO/VICODIN) 5-325 MG tablet   Oral   Take 1 tablet by mouth every 4 (four) hours as needed.   12 tablet   0     Allergies Ibuprofen and Tramadol  No family history on file.  Social History Social History  Substance Use Topics  . Smoking status: Current Every Day Smoker -- 15.00 packs/day    Types: Cigarettes  . Smokeless tobacco: None  . Alcohol Use: Yes     Comment: seldom   Review of Systems  Constitutional: Denies fever.  HEENT: No change from baseline Respiratory: No cough or shortness of breath Musculoskeletal: No pain. Skin: healing wound; pain gradually resolving. ____________________________________________   PHYSICAL EXAM:  VITAL SIGNS: ED Triage Vitals  Enc Vitals Group     BP 01/07/16 1003 153/97 mmHg     Pulse Rate 01/07/16  1003 59     Resp 01/07/16 1003 20     Temp 01/07/16 1003 97.8 F (36.6 C)     Temp Source 01/07/16 1003 Oral     SpO2 01/07/16 1003 96 %     Weight 01/07/16 1003 280 lb (127.007 kg)     Height 01/07/16 1003  (1.803 m)     Head Cir --      Peak Flow --      Pain Score 01/07/16 0959 1     Pain Loc --      Pain Edu? --      Excl. in GC? --     Constitutional: Appears well. No distress HEENT: Atraumtaic, normal appearance, EOMI, sclera normal, voice normal. Respiratory: Respirations even and unlabored.  Cardiovascular: Capillary refill normal. Peripheral pulses 2+ Musculoskeletal: Full ROM x 4. Skin: Edges well approximated with approximately 1 cm open area centrally; granulation tissue obvious without purulent drainage; mild localized erythematous area with induration.  Neurovascular: Gait steady; Alert and oriented x 4.   PROCEDURES  Procedure(s) performed: SUTURE REMOVAL Performed by:   Consent: Verbal consent obtained. Patient identity confirmed: provided demographic data Time out: Immediately prior to procedure a "time out" was called to verify the correct patient, procedure, equipment, support staff and site/side marked as required.  Location details: Left forearm  Wound Appearance: clean  Sutures/Staples Removed: RN   Facility:  sutures placed in this facility Patient tolerance: Patient tolerated the procedure well with no immediate complications.    ____________________________________________   INITIAL IMPRESSION / ASSESSMENT AND PLAN / ED COURSE  Pertinent labs & imaging results that were available during my care of the patient were reviewed by me and considered in my medical decision making (see chart for details).  Prescription for Keflex given and patient advised to return to the emergency department if the erythema spreads or he notices purulent drainage from the wound. Wound care discussed. Patient advised to keep covered with sunscreen. Patient  was advised to return to the ER for symptoms that change or worsen if unable to schedule an appointment with primary care.  ____________________________________________   FINAL CLINICAL IMPRESSION(S) / ED DIAGNOSES  Final diagnoses:  Cellulitis of left upper extremity  Visit for suture removal      Chinita PesterCari B Bonnee Zertuche, FNP 01/07/16 1246  Richardean Canalavid H Yao, MD 01/07/16 1520

## 2016-01-07 NOTE — ED Notes (Signed)
Here for suture removal

## 2016-01-07 NOTE — ED Notes (Signed)
Here for suture removal to left forearm  Area is red and sl swollen  No drainage noted  States couple of sutures fell out prior to arrival

## 2016-06-30 ENCOUNTER — Emergency Department
Admission: EM | Admit: 2016-06-30 | Discharge: 2016-06-30 | Disposition: A | Discharge status: Home / Self Care | Payer: Self-pay | Attending: Student in an Organized Health Care Education/Training Program | Admitting: Student in an Organized Health Care Education/Training Program

## 2016-06-30 ENCOUNTER — Encounter: Payer: Self-pay | Admitting: Emergency Medicine

## 2016-06-30 DIAGNOSIS — Y999 Unspecified external cause status: Secondary | ICD-10-CM | POA: Insufficient documentation

## 2016-06-30 DIAGNOSIS — Y9389 Activity, other specified: Secondary | ICD-10-CM | POA: Insufficient documentation

## 2016-06-30 DIAGNOSIS — Y929 Unspecified place or not applicable: Secondary | ICD-10-CM | POA: Insufficient documentation

## 2016-06-30 DIAGNOSIS — F17210 Nicotine dependence, cigarettes, uncomplicated: Secondary | ICD-10-CM | POA: Insufficient documentation

## 2016-06-30 DIAGNOSIS — S61210A Laceration without foreign body of right index finger without damage to nail, initial encounter: Secondary | ICD-10-CM | POA: Insufficient documentation

## 2016-06-30 DIAGNOSIS — W260XXA Contact with knife, initial encounter: Secondary | ICD-10-CM | POA: Insufficient documentation

## 2016-06-30 NOTE — ED Provider Notes (Signed)
[facility] Emergency Department Provider Note  ____________________________________________  Time seen: Approximately 5:57 PM  I have reviewed the triage vital signs and the nursing notes.   HISTORY  Chief Complaint Laceration   HPI Brian Conner is a 45 y.o. male that presents with a laceration to right index fingertip. Wife was trying to help him cut a wire and the knife slipped. Patient had a significant amount of bleeding at the time but bleeding is now controlled. Patient has not taken anything for pain. Patient got his tetanus shot in May 2017.  History reviewed. No pertinent past medical history.  There are no active problems to display for this patient.   Past Surgical History:  Procedure Laterality Date  . CHOLECYSTECTOMY      Prior to Admission medications   Not on File    Allergies Ibuprofen and Tramadol  History reviewed. No pertinent family history.  Social History Social History  Substance Use Topics  . Smoking status: Current Every Day Smoker    Packs/day: 15.00    Types: Cigarettes  . Smokeless tobacco: Never Used  . Alcohol use Yes     Comment: seldom    Review of Systems  Constitutional:Negative for fever/chills Respiratory: Negative for shortness of breath. Musculoskeletal: Moderate for pain. Skin: No bruising, swelling, rash Neurological: Negative for headaches or numbness. ____________________________________________   PHYSICAL EXAM:  VITAL SIGNS: ED Triage Vitals  Enc Vitals Group     BP 06/30/16 1707 (!) 156/96     Pulse Rate 06/30/16 1707 87     Resp 06/30/16 1707 18     Temp 06/30/16 1707 98.4 F (36.9 C)     Temp Source 06/30/16 1707 Oral     SpO2 06/30/16 1707 96 %     Weight 06/30/16 1708 290 lb (131.5 kg)     Height 06/30/16 1708 5\' 11"  (1.803 m)     Head Circumference --      Peak Flow --      Pain Score 06/30/16 1708 8     Pain Loc --      Pain Edu? --      Excl. in GC? --       Constitutional: Alert and oriented. Well appearing and in no acute distress. Eyes: Conjunctivae are normal. EOMI. Nose: No congestion/rhinnorhea. Mouth/Throat: Mucous membranes are moist.   Neck: No stridor. Cardiovascular: Good peripheral circulation. Respiratory: Normal respiratory effort.  No retractions. Musculoskeletal: FROM throughout. Neurologic:  Normal speech and language. No gross focal neurologic deficits are appreciated. Skin:  1.5 cm laceration to tip of right index finger. No bleeding. No bruising, swelling, erythema.   ____________________________________________   LABS (all labs ordered are listed, but only abnormal results are displayed)  Labs Reviewed - No data to display ____________________________________________  EKG   RADIOLOGY   PROCEDURES  Procedure(s) performed:   Performed by: Enid DerryAshley Malay Fantroy  Consent: Verbal consent obtained.  Consent given by: patient  Prepped and Draped in normal sterile fashion  Wound explored: No foreign bodies present  Laceration Location: Right index finger  Laceration Length: 1.5 cm  Amount of cleaning: 500ml normal saline  Skin closure: Dermabond and steri strips applied  Patient tolerance: Patient tolerated the procedure well with no immediate complications.   ____________________________________________   INITIAL IMPRESSION / ASSESSMENT AND PLAN / ED COURSE  Clinical Course     Pertinent labs & imaging results that were available during my care of the patient were reviewed by me and considered in  my medical decision making (see chart for details).  After irrigating with normal saline, laceration was approximated well. Risks and benefits of sutures versus glue and Steri-Strips were discussed with patient. I do not think sutures are necessary given laceration location if a splint is left in place over finger. Patient agreed to leave the splint on finger for one week to keep wound closed with  Dermabond and Steri-Strips. Patient was instructed to return if laceration does not stay closed with glue and Steri-Strips. Tetanus shot does not need to be updated since it was updated in May 2017.  ____________________________________________   FINAL CLINICAL IMPRESSION(S) / ED DIAGNOSES  Final diagnoses:  Laceration of right index finger without foreign body, nail damage status unspecified, initial encounter    Discharge Medication List as of 06/30/2016  6:06 PM      Note:  This document was prepared using Dragon voice recognition software and may include unintentional dictation errors.   Enid DerryAshley Satoshi Kalas, PA-C 06/30/16 2012    Willy EddyPatrick Robinson, MD 06/30/16 [id number]

## 2016-06-30 NOTE — Discharge Instructions (Signed)
Keep splint on finger for 1 week. Return if laceration does not stay closed.

## 2016-06-30 NOTE — ED Notes (Signed)
Right index finger soaking in saline at this time.

## 2016-06-30 NOTE — ED Triage Notes (Signed)
Pt arrived via POV, pt has laceration to right index fingertip after wife trying to help him cut a wire.  Pt had tetanus shot in May 2017.  Bleeding controlled at this time.

## 2017-02-19 ENCOUNTER — Encounter: Payer: Self-pay | Admitting: Emergency Medicine

## 2017-02-19 ENCOUNTER — Emergency Department
Admission: EM | Admit: 2017-02-19 | Discharge: 2017-02-19 | Disposition: A | Discharge status: Home / Self Care | Payer: Self-pay | Attending: Emergency Medicine | Admitting: Emergency Medicine

## 2017-02-19 DIAGNOSIS — W458XXA Other foreign body or object entering through skin, initial encounter: Secondary | ICD-10-CM | POA: Insufficient documentation

## 2017-02-19 DIAGNOSIS — Y939 Activity, unspecified: Secondary | ICD-10-CM | POA: Insufficient documentation

## 2017-02-19 DIAGNOSIS — S6991XA Unspecified injury of right wrist, hand and finger(s), initial encounter: Secondary | ICD-10-CM

## 2017-02-19 DIAGNOSIS — Y999 Unspecified external cause status: Secondary | ICD-10-CM | POA: Insufficient documentation

## 2017-02-19 DIAGNOSIS — Y929 Unspecified place or not applicable: Secondary | ICD-10-CM | POA: Insufficient documentation

## 2017-02-19 DIAGNOSIS — S60351A Superficial foreign body of right thumb, initial encounter: Secondary | ICD-10-CM | POA: Insufficient documentation

## 2017-02-19 MED ORDER — LIDOCAINE HCL (PF) 1 % IJ SOLN
INTRAMUSCULAR | Status: AC
  Filled 2017-02-19: qty 5

## 2017-02-19 MED ORDER — OXYCODONE-ACETAMINOPHEN 7.5-325 MG PO TABS: 1.0000 | ORAL_TABLET | Freq: Four times a day (QID) | ORAL | 0 refills | Status: DC | PRN

## 2017-02-19 MED ORDER — OXYCODONE-ACETAMINOPHEN 5-325 MG PO TABS
1.0000 | ORAL_TABLET | Freq: Once | ORAL | Status: AC
Start: 2017-02-19 — End: 2017-02-19
  Administered 2017-02-19: 1 via ORAL
  Filled 2017-02-19: qty 1

## 2017-02-19 NOTE — ED Provider Notes (Signed)
[facility] Emergency Department Provider Note   ____________________________________________   First MD Initiated Contact with Patient 02/19/17 1257     (approximate)  I have reviewed the triage vital signs and the nursing notes.   HISTORY  Chief Complaint Hand Injury    HPI Brian Conner is a 46 y.o. male patient complaining of fishhook to the right thumb. Incident occurred prior to arrival.Patient rates his pain as 8/10. Patient described a pain as "sharp". Patient state unable to dislodge foreign body. Patient is right-hand dominant. Patient tetanus shot is up-to-date.   History reviewed. No pertinent past medical history.  There are no active problems to display for this patient.   Past Surgical History:  Procedure Laterality Date  . CHOLECYSTECTOMY      Prior to Admission medications   Medication Sig Start Date End Date Taking? Authorizing Provider  oxyCODONE-acetaminophen (PERCOCET) 7.5-325 MG tablet Take 1 tablet by mouth every 6 (six) hours as needed for severe pain. 02/19/17   Joni ReiningSmith, Lochlyn Zullo K, PA-C    Allergies Ibuprofen and Tramadol  History reviewed. No pertinent family history.  Social History Social History  Substance Use Topics  . Smoking status: Current Every Day Smoker    Packs/day: 15.00    Types: Cigarettes  . Smokeless tobacco: Never Used  . Alcohol use Yes     Comment: seldom    Review of Systems  Constitutional: No fever/chills Eyes: No visual changes. ENT: No sore throat. Cardiovascular: Denies chest pain. Respiratory: Denies shortness of breath. Gastrointestinal: No abdominal pain.  No nausea, no vomiting.  No diarrhea.  No constipation. Genitourinary: Negative for dysuria. Musculoskeletal: Negative for back pain. Skin: Negative for rash. Foreign body right thumb Neurological: Negative for headaches, focal weakness or numbness. Allergic/Immunilogical: Tramadol and ibuprofen  ____________________________________________   PHYSICAL EXAM:  VITAL SIGNS: ED Triage Vitals  Enc Vitals Group     BP 02/19/17 1251 (!) 167/89     Pulse Rate 02/19/17 1251 79     Resp 02/19/17 1251 18     Temp 02/19/17 1251 98 F (36.7 C)     Temp Source 02/19/17 1251 Oral     SpO2 02/19/17 1251 96 %     Weight 02/19/17 1250 300 lb (136.1 kg)     Height 02/19/17 1250 5\' 11"  (1.803 m)     Head Circumference --      Peak Flow --      Pain Score 02/19/17 1250 8     Pain Loc --      Pain Edu? --      Excl. in GC? --     Constitutional: Alert and oriented. Well appearing and in no acute distress. Cardiovascular: Normal rate, regular rhythm. Grossly normal heart sounds.  Good peripheral circulation. Elevated blood pressure Respiratory: Normal respiratory effort.  No retractions. Lungs CTAB. Neurologic:  Normal speech and language. No gross focal neurologic deficits are appreciated. No gait instability. Skin:  Skin is warm, dry and intact. No rash noted. Fishhook embedded in the right thumb. Patient has full nuchal range of motion of the affected digit. Psychiatric: Mood and affect are normal. Speech and behavior are normal.  ____________________________________________   LABS (all labs ordered are listed, but only abnormal results are displayed)  Labs Reviewed - No data to display ____________________________________________  EKG   ____________________________________________  RADIOLOGY  No results found.  ____________________________________________   PROCEDURES  Procedure(s) performed: None  Procedures  Critical Care performed: No  ____________________________________________  INITIAL IMPRESSION / ASSESSMENT AND PLAN / ED COURSE  Pertinent labs & imaging results that were available during my care of the patient were reviewed by me and considered in my medical decision making (see chart for details).  Fishhook right thumb. Status post digital block  fishhook was pushed through skin. Lesle Reek was cut off a fishhook backed out of skin. Pressure dressing applied. Patient given discharge care instructions. Patient advised return back if condition worsens.     ____________________________________________   FINAL CLINICAL IMPRESSION(S) / ED DIAGNOSES  Final diagnoses:  Fish hook injury of finger of right hand, initial encounter      NEW MEDICATIONS STARTED DURING THIS VISIT:  New Prescriptions   OXYCODONE-ACETAMINOPHEN (PERCOCET) 7.5-325 MG TABLET    Take 1 tablet by mouth every 6 (six) hours as needed for severe pain.     Note:  This document was prepared using Dragon voice recognition software and may include unintentional dictation errors.    Joni Reining, PA-C 02/19/17 1338    Phineas Semen, MD 02/19/17 [phone]

## 2017-02-19 NOTE — ED Notes (Signed)
See triage note. Fish hook in right hand PTA. Pt alert and oriented X4, active, cooperative, pt in NAD. RR even and unlabored, color WNL.

## 2017-02-19 NOTE — ED Notes (Signed)
ED Provider at bedside. 

## 2017-02-19 NOTE — ED Notes (Signed)
Pt alert and oriented X4, active, cooperative, pt in NAD. RR even and unlabored, color WNL.  Pt informed to return if any life threatening symptoms occur.   

## 2017-02-19 NOTE — ED Triage Notes (Signed)
Pt got fishing luer in right thumb.  NAD. Tdap UTD. Ambulatory to triage.

## 2017-04-01 ENCOUNTER — Emergency Department
Admission: EM | Admit: 2017-04-01 | Discharge: 2017-04-01 | Payer: Self-pay | Attending: Emergency Medicine | Admitting: Emergency Medicine

## 2017-04-01 ENCOUNTER — Emergency Department: Payer: Self-pay

## 2017-04-01 ENCOUNTER — Emergency Department: Admission: EM | Admit: 2017-04-01 | Discharge: 2017-04-01 | Discharge status: Against Medical Advice | Payer: Self-pay

## 2017-04-01 DIAGNOSIS — Z23 Encounter for immunization: Secondary | ICD-10-CM | POA: Insufficient documentation

## 2017-04-01 DIAGNOSIS — Y939 Activity, unspecified: Secondary | ICD-10-CM | POA: Insufficient documentation

## 2017-04-01 DIAGNOSIS — S60511A Abrasion of right hand, initial encounter: Secondary | ICD-10-CM | POA: Insufficient documentation

## 2017-04-01 DIAGNOSIS — W25XXXA Contact with sharp glass, initial encounter: Secondary | ICD-10-CM | POA: Insufficient documentation

## 2017-04-01 DIAGNOSIS — F17210 Nicotine dependence, cigarettes, uncomplicated: Secondary | ICD-10-CM | POA: Insufficient documentation

## 2017-04-01 DIAGNOSIS — Y929 Unspecified place or not applicable: Secondary | ICD-10-CM | POA: Insufficient documentation

## 2017-04-01 DIAGNOSIS — Y999 Unspecified external cause status: Secondary | ICD-10-CM | POA: Insufficient documentation

## 2017-04-01 DIAGNOSIS — M79641 Pain in right hand: Secondary | ICD-10-CM | POA: Insufficient documentation

## 2017-04-01 MED ORDER — TETANUS-DIPHTH-ACELL PERTUSSIS 5-2.5-18.5 LF-MCG/0.5 IM SUSP
0.5000 mL | Freq: Once | INTRAMUSCULAR | Status: DC
  Filled 2017-04-01: qty 0.5

## 2017-04-01 NOTE — ED Notes (Signed)
Pt. Crying with Hydrographic surveyor.  Pt. Tearfully stated he has had T-dap shot in the past 2 years.

## 2017-04-01 NOTE — ED Triage Notes (Signed)
Patient brought in by [employer] for medical clearance for jail. Patient uncooperative yelling and cussing at [employer] and staff at this time. Patient refusing vital signs or arm band at this time. Patient refusing to answer any questions.

## 2017-04-01 NOTE — ED Notes (Signed)
Patient became cooperative with [place] [employer] and performed a breath analysis and patient was taken to jail by PD without being seen in the ED.
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
## 2017-04-01 NOTE — ED Notes (Signed)
Patient returned from radiology
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
## 2017-09-02 ENCOUNTER — Encounter: Payer: Self-pay | Admitting: Emergency Medicine

## 2017-09-02 ENCOUNTER — Emergency Department: Payer: Self-pay

## 2017-09-02 ENCOUNTER — Other Ambulatory Visit: Payer: Self-pay

## 2017-09-02 ENCOUNTER — Emergency Department
Admission: EM | Admit: 2017-09-02 | Discharge: 2017-09-02 | Disposition: A | Discharge status: Home / Self Care | Payer: Self-pay | Attending: Emergency Medicine | Admitting: Emergency Medicine

## 2017-09-02 DIAGNOSIS — S93602A Unspecified sprain of left foot, initial encounter: Secondary | ICD-10-CM | POA: Insufficient documentation

## 2017-09-02 DIAGNOSIS — Y929 Unspecified place or not applicable: Secondary | ICD-10-CM | POA: Insufficient documentation

## 2017-09-02 DIAGNOSIS — F17210 Nicotine dependence, cigarettes, uncomplicated: Secondary | ICD-10-CM | POA: Insufficient documentation

## 2017-09-02 DIAGNOSIS — Y939 Activity, unspecified: Secondary | ICD-10-CM | POA: Insufficient documentation

## 2017-09-02 DIAGNOSIS — Y999 Unspecified external cause status: Secondary | ICD-10-CM | POA: Insufficient documentation

## 2017-09-02 DIAGNOSIS — W010XXA Fall on same level from slipping, tripping and stumbling without subsequent striking against object, initial encounter: Secondary | ICD-10-CM | POA: Insufficient documentation

## 2017-09-02 MED ORDER — CYCLOBENZAPRINE HCL 5 MG PO TABS: 5.0000 mg | ORAL_TABLET | Freq: Three times a day (TID) | ORAL | 0 refills | Status: DC | PRN

## 2017-09-02 MED ORDER — MELOXICAM 15 MG PO TABS: 15.0000 mg | ORAL_TABLET | Freq: Every day | ORAL | 0 refills | Status: AC

## 2017-09-02 NOTE — ED Notes (Signed)
Radiology to bedside at this time. 

## 2017-09-02 NOTE — ED Provider Notes (Signed)
[facility] Emergency Department Provider Note ____________________________________________  Time seen: 1113  I have reviewed the triage vital signs and the nursing notes.  HISTORY  Chief Complaint  Foot Injury  HPI Brian Conner is a 47 y.o. male presented to the ED accompanied by his wife, for evaluation intermittently over the last 2 months.  Patient is unclear of how he may have injured his foot.  He describes however, most recently, he tripped over a piece of wood in the floor last night.  He describes pain increased with attempts to weight-bear and walk to the foot.  He localizes pain to the medial and dorsal aspect of the midfoot.  He denies any ankle pain, foot swelling, shortness of breath, or chest pain.  History reviewed. No pertinent past medical history.  There are no active problems to display for this patient.   Past Surgical History:  Procedure Laterality Date  . CHOLECYSTECTOMY      Prior to Admission medications   Medication Sig Start Date End Date Taking? Authorizing Provider  cyclobenzaprine (FLEXERIL) 5 MG tablet Take 1 tablet (5 mg total) by mouth 3 (three) times daily as needed for muscle spasms. 09/02/17   Akaysha Cobern, Charlesetta IvoryJenise V Bacon, PA-C  meloxicam (MOBIC) 15 MG tablet Take 1 tablet (15 mg total) by mouth daily. 09/02/17 10/02/17  Jerriyah Louis, Charlesetta IvoryJenise V Bacon, PA-C  oxyCODONE-acetaminophen (PERCOCET) 7.5-325 MG tablet Take 1 tablet by mouth every 6 (six) hours as needed for severe pain. 02/19/17   Joni ReiningSmith, Ronald K, PA-C    Allergies Ibuprofen and Tramadol  Patient notes he has taken naproxen in the past without rash or swelling.   History reviewed. No pertinent family history.  Social History Social History   Tobacco Use  . Smoking status: Current Every Conner Smoker    Packs/Conner: 15.00    Types: Cigarettes  . Smokeless tobacco: Never Used  Substance Use Topics  . Alcohol use: Yes    Comment: seldom  . Drug use: Not on file    Review of  Systems  Constitutional: Negative for fever. Cardiovascular: Negative for chest pain. Respiratory: Negative for shortness of breath. Musculoskeletal: Negative for back pain.  Left foot pain as above. Skin: Negative for rash. Neurological: Negative for headaches, focal weakness or numbness. ____________________________________________  PHYSICAL EXAM:  VITAL SIGNS: ED Triage Vitals  Enc Vitals Group     BP 09/02/17 1044 (!) 150/95     Pulse Rate 09/02/17 1044 76     Resp 09/02/17 1044 18     Temp 09/02/17 1044 97.9 F (36.6 C)     Temp Source 09/02/17 1044 Oral     SpO2 09/02/17 1044 93 %     Weight 09/02/17 1045 300 lb (136.1 kg)     Height 09/02/17 1045 5\' 11"  (1.803 m)     Head Circumference --      Peak Flow --      Pain Score 09/02/17 1041 7     Pain Loc --      Pain Edu? --      Excl. in GC? --     Constitutional: Alert and oriented. Well appearing and in no distress. Head: Normocephalic and atraumatic. Cardiovascular: Normal rate, regular rhythm. Normal distal pulses. Normal cap refill Respiratory: Normal respiratory effort.  Musculoskeletal: Left foot without any obvious deformity, dislocation, or effusion.  Ankle is also normal without any joint effusion appreciated.  Patient with mild tenderness to palpation over the dorsal medial aspect of the midfoot.  No calf or Achilles tenderness is appreciated.  No heel pain is noted.  Normal ankle range of motion on exam.  Negative anterior/posterior drawer. Nontender with normal range of motion in all extremities.  Neurologic:  Normal gross sensation. No gross focal neurologic deficits are appreciated. Skin:  Skin is warm, dry and intact. No rash noted. ____________________________________________   RADIOLOGY  Left Foot  IMPRESSION: 1. On the frontal view, there is an equivocal transverse lucency at the junction of the proximal and mid thirds of the second metatarsal. It is difficult to ascertain whether this  area represents a prominent nutrient foramen versus a subtle incomplete fracture. Clinical assessment at this site advised. If there is focal tenderness in this area, it would be reasonable to immobilize and treat this area as a subtle fracture.  2.  No other area of potential fracture.  No dislocation.  3. 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 Other joint spaces appear unremarkable. There is an inferior calcaneal spur.  I, Darold Miley, Charlesetta Ivory, personally viewed and evaluated these images (plain radiographs) as part of my medical decision making, as well as reviewing the written report by the radiologist. ____________________________________________  PROCEDURES  .Splint Application Date/Time: 09/02/2017 12:36 PM Performed by: Lindajo Royal, NT Authorized by: Lissa Hoard, PA-C   Consent:    Consent obtained:  Verbal   Consent given by:  Patient Pre-procedure details:    Sensation:  Normal Procedure details:    Laterality:  Left   Location:  Foot   Splint type: Post-op Shoe.   Supplies:  Prefabricated splint Post-procedure details:    Pain:  Improved   Sensation:  Normal   Patient tolerance of procedure:  Tolerated well, no immediate complications  ____________________________________________  INITIAL IMPRESSION / ASSESSMENT AND PLAN / ED COURSE  Patient with left foot pain to the dorso-medial foot. His exam and x-ray do not confirm any acute injury over the midfoot. We reviewed the images together, and he confirms his pain is in the midfoot, not the forefoot. He is placed in a post-op shoe and given prescriptions for meloxicam and cyclobenzaprine. He is referred to podiatry for further management.  ____________________________________________  FINAL CLINICAL IMPRESSION(S) / ED DIAGNOSES  Final diagnoses:  Sprain of left foot, initial encounter      Lissa Hoard, PA-C 09/02/17 1248    Governor Rooks,  MD 09/02/17 1536

## 2017-09-02 NOTE — Discharge Instructions (Signed)
Your exam and x-ray are consistent with a foot sprain. There is a small area to the middle toe that may be an old injury or just a nutrient vessel. It is not the sam area where you note your foot pain. Wear the post-op shoe for comfort. Rest, ice and elevate the foot for comfort. Take the prescription meds as directed. Follow-up with podiatry for continued symptoms.

## 2017-09-02 NOTE — ED Triage Notes (Signed)
Pt c/o left foot pain and bruising unsure how he hurt it. Pain since yesterday. Pt in wheelchair on arrival.

## 2018-01-30 ENCOUNTER — Emergency Department: Payer: Self-pay

## 2018-01-30 ENCOUNTER — Emergency Department
Admission: EM | Admit: 2018-01-30 | Discharge: 2018-01-30 | Disposition: A | Discharge status: Home / Self Care | Payer: Self-pay | Attending: Student in an Organized Health Care Education/Training Program | Admitting: Student in an Organized Health Care Education/Training Program

## 2018-01-30 ENCOUNTER — Encounter: Payer: Self-pay | Admitting: Emergency Medicine

## 2018-01-30 ENCOUNTER — Other Ambulatory Visit: Payer: Self-pay

## 2018-01-30 DIAGNOSIS — K649 Unspecified hemorrhoids: Secondary | ICD-10-CM | POA: Insufficient documentation

## 2018-01-30 DIAGNOSIS — F17210 Nicotine dependence, cigarettes, uncomplicated: Secondary | ICD-10-CM | POA: Insufficient documentation

## 2018-01-30 LAB — BASIC METABOLIC PANEL
Anion gap: 8 (ref 5–15)
BUN: 13 mg/dL (ref 6–20)
CALCIUM: 9.2 mg/dL (ref 8.9–10.3)
CO2: 25 mmol/L (ref 22–32)
CREATININE: 1.11 mg/dL (ref 0.61–1.24)
Chloride: 104 mmol/L (ref 101–111)
GFR calc Af Amer: >60 mL/min (ref >60)
GFR calc non Af Amer: >60 mL/min (ref >60)
Glucose, Bld: 124 mg/dL — ABNORMAL HIGH (ref 65–99)
Potassium: 3.9 mmol/L (ref 3.5–5.1)
SODIUM: 137 mmol/L (ref 135–145)

## 2018-01-30 LAB — CBC WITH DIFFERENTIAL/PLATELET
BASOS PCT: 1 %
Basophils Absolute: 0.1 10*3/uL (ref 0–0.1)
EOS ABS: 0.2 10*3/uL (ref 0–0.7)
EOS PCT: 2 %
HCT: 49.3 % (ref 40.0–52.0)
Hemoglobin: 16.9 g/dL (ref 13.0–18.0)
LYMPHS ABS: 3.1 10*3/uL (ref 1.0–3.6)
Lymphocytes Relative: 36 %
MCH: 32 pg (ref 26.0–34.0)
MCHC: 34.4 g/dL (ref 32.0–36.0)
MCV: 93.1 fL (ref 80.0–100.0)
Monocytes Absolute: 0.8 10*3/uL (ref 0.2–1.0)
Monocytes Relative: 9 %
Neutro Abs: 4.6 10*3/uL (ref 1.4–6.5)
Neutrophils Relative %: 52 %
Platelets: 225 10*3/uL (ref 150–440)
RBC: 5.29 MIL/uL (ref 4.40–5.90)
RDW: 14.7 % — ABNORMAL HIGH (ref 11.5–14.5)
WBC: 8.7 10*3/uL (ref 3.8–10.6)

## 2018-01-30 MED ORDER — LIDOCAINE HCL URETHRAL/MUCOSAL 2 % EX GEL
CUTANEOUS | Status: AC
  Administered 2018-01-30: 1 via TOPICAL
  Filled 2018-01-30: qty 10

## 2018-01-30 MED ORDER — DIBUCAINE 1 % RE OINT: 1.0000 "application " | TOPICAL_OINTMENT | RECTAL | 1 refills | Status: AC | PRN

## 2018-01-30 MED ORDER — HYDROCORTISONE 2.5 % RE CREA: TOPICAL_CREAM | RECTAL | 1 refills | Status: DC

## 2018-01-30 MED ORDER — POLYETHYLENE GLYCOL 3350 17 G PO PACK: 17.0000 g | PACK | Freq: Every day | ORAL | 0 refills | Status: DC

## 2018-01-30 MED ORDER — GLYCERIN (LAXATIVE) 1.2 G RE SUPP
RECTAL | Status: AC
  Filled 2018-01-30: qty 1

## 2018-01-30 MED ORDER — LIDOCAINE HCL URETHRAL/MUCOSAL 2 % EX GEL
1.0000 "application " | Freq: Once | CUTANEOUS | Status: AC
  Administered 2018-01-30: 1 via TOPICAL
  Filled 2018-01-30: qty 5

## 2018-01-30 NOTE — ED Provider Notes (Signed)
[facility] Emergency Department Provider Note    First MD Initiated Contact with Patient 01/30/18 1521     (approximate)  I have reviewed the triage vital signs and the nursing notes.   HISTORY  Chief Complaint Hemorrhoids    HPI Brian Conner is a 47 y.o. male with a history of rectal hemorrhoids as well as thrombosed and was presents to the ER with several days of worsening perirectal discomfort itching and feeling recurrent hemorrhoids.  States he has been trying to push them back and has been scratching them.  Has noted some bleeding.  Denies any fevers.  No nausea or vomiting.  Has not been on any medications recently.  Took one stool softener.  Tried Preparation H 1 day without improvement in symptoms so he presented to the ER    History reviewed. No pertinent past medical history. History reviewed. No pertinent family history. Past Surgical History:  Procedure Laterality Date  . CHOLECYSTECTOMY     There are no active problems to display for this patient.     Prior to Admission medications   Medication Sig Start Date End Date Taking? Authorizing Provider  cyclobenzaprine (FLEXERIL) 5 MG tablet Take 1 tablet (5 mg total) by mouth 3 (three) times daily as needed for muscle spasms. 09/02/17   Menshew, Charlesetta Ivory, PA-C  oxyCODONE-acetaminophen (PERCOCET) 7.5-325 MG tablet Take 1 tablet by mouth every 6 (six) hours as needed for severe pain. 02/19/17   Joni Reining, PA-C    Allergies Ibuprofen and Tramadol    Social History Social History   Tobacco Use  . Smoking status: Current Every Day Smoker    Packs/day: 15.00    Types: Cigarettes  . Smokeless tobacco: Never Used  Substance Use Topics  . Alcohol use: Yes    Comment: seldom  . Drug use: Not on file    Review of Systems Patient denies headaches, rhinorrhea, blurry vision, numbness, shortness of breath, chest pain, edema, cough, abdominal pain, nausea, vomiting, diarrhea,  dysuria, fevers, rashes or hallucinations unless otherwise stated above in HPI. ____________________________________________   PHYSICAL EXAM:  VITAL SIGNS: Vitals:   01/30/18 1449  BP: (!) 144/83  Pulse: 74  Temp: 98.5 F (36.9 C)  SpO2: 96%    Constitutional: Alert and oriented.  Eyes: Conjunctivae are normal.  Head: Atraumatic. Nose: No congestion/rhinnorhea. Mouth/Throat: Mucous membranes are moist.   Neck: No stridor. Painless ROM.  Cardiovascular: Normal rate, regular rhythm. Grossly normal heart sounds.  Good peripheral circulation. Respiratory: Normal respiratory effort.  No retractions. Lungs CTAB. Gastrointestinal: morbidly obese, Soft and nontender. No distention. No abdominal bruits. No CVA tenderness. Genitourinary: Does have multiple hemorrhoids none of which appear to be thrombosed.  No perirectal fullness or fluctuance.  No purulence. Musculoskeletal: No lower extremity tenderness nor edema.  No joint effusions. Neurologic:  Normal speech and language. No gross focal neurologic deficits are appreciated. No facial droop Skin:  Skin is warm, dry and intact. No rash noted. Psychiatric: Mood and affect are normal. Speech and behavior are normal.  ____________________________________________   LABS (all labs ordered are listed, but only abnormal results are displayed)  Results for orders placed or performed during the hospital encounter of 01/30/18 (from the past 24 hour(s))  CBC with Differential/Platelet     Status: Abnormal   Collection Time: 01/30/18  3:43 PM  Result Value Ref Range   WBC 8.7 3.8 - 10.6 K/uL   RBC 5.29 4.40 - 5.90 MIL/uL  Hemoglobin 16.9 13.0 - 18.0 g/dL   HCT 86.549.3 78.440.0 - 69.652.0 %   MCV 93.1 80.0 - 100.0 fL   MCH 32.0 26.0 - 34.0 pg   MCHC 34.4 32.0 - 36.0 g/dL   RDW 29.514.7 (H) 28.411.5 - 13.214.5 %   Platelets 225 150 - 440 K/uL   Neutrophils Relative % 52 %   Neutro Abs 4.6 1.4 - 6.5 K/uL   Lymphocytes Relative 36 %   Lymphs Abs 3.1 1.0 - 3.6  K/uL   Monocytes Relative 9 %   Monocytes Absolute 0.8 0.2 - 1.0 K/uL   Eosinophils Relative 2 %   Eosinophils Absolute 0.2 0 - 0.7 K/uL   Basophils Relative 1 %   Basophils Absolute 0.1 0 - 0.1 K/uL  Basic metabolic panel     Status: Abnormal   Collection Time: 01/30/18  3:43 PM  Result Value Ref Range   Sodium 137 135 - 145 mmol/L   Potassium 3.9 3.5 - 5.1 mmol/L   Chloride 104 101 - 111 mmol/L   CO2 25 22 - 32 mmol/L   Glucose, Bld 124 (H) 65 - 99 mg/dL   BUN 13 6 - 20 mg/dL   Creatinine, Ser 4.401.11 0.61 - 1.24 mg/dL   Calcium 9.2 8.9 - 10.210.3 mg/dL   GFR calc non Af Amer >60 >60 mL/min   GFR calc Af Amer >60 >60 mL/min   Anion gap 8 5 - 15   ____________________________________________   ____________________________________________  RADIOLOGY  I personally reviewed all radiographic images ordered to evaluate for the above acute complaints and reviewed radiology reports and findings.  These findings were personally discussed with the patient.  Please see medical record for radiology report.  ____________________________________________   PROCEDURES  Procedure(s) performed:  Procedures    Critical Care performed: no ____________________________________________   INITIAL IMPRESSION / ASSESSMENT AND PLAN / ED COURSE  Pertinent labs & imaging results that were available during my care of the patient were reviewed by me and considered in my medical decision making (see chart for details).   DDX: hemorrhoid, abscess, cellulitis, mass, proctitis, constipation, anal fissure  Brian Conner is a 47 y.o. who presents to the ED with was as described above.  Patient has evidence of acute nonthrombosed hemorrhoids.  Does have an area of irritation on right sided hemorrhoid causing some of the significant discomfort.  He has no signs or symptoms of abscess or cellulitis.  His blood work is reassuring.  X-ray does show moderate stool burden likely worsening his symptoms.  Patient  will be started on stool softener as well as given topical medications for hemorrhoids.  Patient will be given referral to gastroenterology.  Discussed signs and symptoms for which the patient should return to the ER.  He is encouraged not to scratch or try to manually replace hemorrhoids due to worsening his symptomatology and irritation.  Have discussed with the patient and available family all diagnostics and treatments performed thus far and all questions were answered to the best of my ability. The patient demonstrates understanding and agreement with plan.       As part of my medical decision making, I reviewed the following data within the electronic MEDICAL RECORD NUMBER Nursing notes reviewed and incorporated, Labs reviewed, notes from prior ED visits.   ____________________________________________   FINAL CLINICAL IMPRESSION(S) / ED DIAGNOSES  Final diagnoses:  Acute hemorrhoid      NEW MEDICATIONS STARTED DURING THIS VISIT:  New Prescriptions   No  medications on file     Note:  This document was prepared using Dragon voice recognition software and may include unintentional dictation errors.    Willy Eddy, MD 01/30/18 1630

## 2018-01-30 NOTE — Discharge Instructions (Addendum)

## 2018-01-30 NOTE — ED Triage Notes (Signed)
Pt reports that he has history of getting hemorrhoids, he states that he has had to have them lanced. He states they have started to flare up again this week, and got worse when he has tried to have a BM the last few days.

## 2018-04-10 ENCOUNTER — Emergency Department: Payer: Medicaid Other

## 2018-04-10 ENCOUNTER — Other Ambulatory Visit: Payer: Self-pay

## 2018-04-10 ENCOUNTER — Emergency Department
Admission: EM | Admit: 2018-04-10 | Discharge: 2018-04-10 | Disposition: A | Discharge status: Home / Self Care | Payer: Medicaid Other | Attending: Emergency Medicine | Admitting: Emergency Medicine

## 2018-04-10 ENCOUNTER — Encounter: Payer: Self-pay | Admitting: *Deleted

## 2018-04-10 DIAGNOSIS — Y999 Unspecified external cause status: Secondary | ICD-10-CM | POA: Insufficient documentation

## 2018-04-10 DIAGNOSIS — Z87891 Personal history of nicotine dependence: Secondary | ICD-10-CM | POA: Insufficient documentation

## 2018-04-10 DIAGNOSIS — W268XXA Contact with other sharp object(s), not elsewhere classified, initial encounter: Secondary | ICD-10-CM | POA: Insufficient documentation

## 2018-04-10 DIAGNOSIS — Y929 Unspecified place or not applicable: Secondary | ICD-10-CM | POA: Insufficient documentation

## 2018-04-10 DIAGNOSIS — S61411A Laceration without foreign body of right hand, initial encounter: Secondary | ICD-10-CM | POA: Diagnosis present

## 2018-04-10 DIAGNOSIS — Z79899 Other long term (current) drug therapy: Secondary | ICD-10-CM | POA: Diagnosis not present

## 2018-04-10 DIAGNOSIS — Y939 Activity, unspecified: Secondary | ICD-10-CM | POA: Insufficient documentation

## 2018-04-10 MED ORDER — LIDOCAINE HCL (PF) 1 % IJ SOLN
INTRAMUSCULAR | Status: AC
  Filled 2018-04-10: qty 5

## 2018-04-10 MED ORDER — AMOXICILLIN-POT CLAVULANATE 875-125 MG PO TABS: 1.0000 | ORAL_TABLET | Freq: Two times a day (BID) | ORAL | 0 refills | Status: AC

## 2018-04-10 MED ORDER — OXYCODONE-ACETAMINOPHEN 5-325 MG PO TABS: 1.0000 | ORAL_TABLET | Freq: Three times a day (TID) | ORAL | 0 refills | Status: DC | PRN

## 2018-04-10 MED ORDER — OXYCODONE-ACETAMINOPHEN 5-325 MG PO TABS
1.0000 | ORAL_TABLET | Freq: Once | ORAL | Status: AC
  Administered 2018-04-10: 1 via ORAL
  Filled 2018-04-10: qty 1

## 2018-04-10 NOTE — ED Provider Notes (Signed)
[facility] Emergency Department Provider Note    None    (approximate)  I have reviewed the triage vital signs and the nursing notes.   HISTORY  Chief Complaint Laceration    HPI Brian BOTTO is a 47 y.o. male presents to the emergency department with a laceration on the dorsal aspect of the right hand which patient states he sustained when a saw blade fell from a shelf onto his hand tonight.  Patient admits to 9 out of 10 pain.   Past medical history No chronic conditions There are no active problems to display for this patient.   Past Surgical History:  Procedure Laterality Date  . CHOLECYSTECTOMY      Prior to Admission medications   Medication Sig Start Date End Date Taking? Authorizing Provider  cyclobenzaprine (FLEXERIL) 5 MG tablet Take 1 tablet (5 mg total) by mouth 3 (three) times daily as needed for muscle spasms. 09/02/17   Menshew, Charlesetta Ivory, PA-C  dibucaine (NUPERCAINAL) 1 % OINT Place 1 application rectally as needed for hemorrhoids. 01/30/18   Willy Eddy, MD  hydrocortisone (ANUSOL-HC) 2.5 % rectal cream Apply rectally 2 times daily 01/30/18 01/30/19  Willy Eddy, MD  oxyCODONE-acetaminophen (PERCOCET) 7.5-325 MG tablet Take 1 tablet by mouth every 6 (six) hours as needed for severe pain. 02/19/17   Joni Reining, PA-C  polyethylene glycol [facility] / GLYCOLAX) packet Take 17 g by mouth daily. Mix one tablespoon with 8oz of your favorite juice or water every day until you are having soft formed stools. Then start taking once daily if you didn't have a stool the day before. 01/30/18   Willy Eddy, MD    Allergies Ibuprofen and Tramadol  No family history on file.  Social History Social History   Tobacco Use  . Smoking status: Former Smoker    Packs/day: 15.00    Types: Cigarettes  . Smokeless tobacco: Never Used  Substance Use Topics  . Alcohol use: Yes    Comment: seldom  . Drug use: Never    Review  of Systems Constitutional: No fever/chills Eyes: No visual changes. ENT: No sore throat. Cardiovascular: Denies chest pain. Respiratory: Denies shortness of breath. Gastrointestinal: No abdominal pain.  No nausea, no vomiting.  No diarrhea.  No constipation. Genitourinary: Negative for dysuria. Musculoskeletal: Negative for neck pain.  Negative for back pain. Integumentary: Negative for rash.  Positive for right hand laceration Neurological: Negative for headaches, focal weakness or numbness.   ____________________________________________   PHYSICAL EXAM:  VITAL SIGNS: ED Triage Vitals  Enc Vitals Group     BP 04/10/18 0145 (!) 103/91     Pulse Rate 04/10/18 0145 83     Resp 04/10/18 0145 20     Temp 04/10/18 0145 98.6 F (37 C)     Temp Source 04/10/18 0145 Oral     SpO2 04/10/18 0145 95 %     Weight 04/10/18 0146 127 kg (280 lb)     Height 04/10/18 0146 1.803 m (5\' 11" )     Head Circumference --      Peak Flow --      Pain Score 04/10/18 0146 7     Pain Loc --      Pain Edu? --      Excl. in GC? --     Constitutional: Alert and oriented. Well appearing and in no acute distress. Eyes: Conjunctivae are normal.  Head: Atraumatic. Mouth/Throat: Mucous membranes are moist.Oropharynx non-erythematous. Neck: No stridor.  No meningeal signs.   Cardiovascular: Normal rate, regular rhythm. Good peripheral circulation. Grossly normal heart sounds. Respiratory: Normal respiratory effort.  No retractions. Lungs CTAB.  Musculoskeletal: No lower extremity tenderness nor edema. No gross deformities of extremities.  14 cm circumlinear laceration dorsal aspect of the right hand extending to the PIP joint of the little finger Neurologic:  Normal speech and language. No gross focal neurologic deficits are appreciated.  Skin:  14 cm circumlinear laceration dorsal aspect of the right hand extending to the PIP joint of the little  finger    _________________________________  RADIOLOGY I, [place] Shahiem Bedwell, personally viewed and evaluated these images (plain radiographs) as part of my medical decision making, as well as reviewing the written report by the radiologist.  ED MD interpretation: No acute fracture or foreign body noted on right hand x-ray  Official radiology report(s): Dg Hand Complete Right  Result Date: 04/10/2018 CLINICAL DATA:  Laceration to top of right hand and right 3rd finger EXAM: RIGHT HAND - COMPLETE 3+ VIEW COMPARISON:  04/01/2017 FINDINGS: Deformity of the right 5th metacarpal again noted compatible with old healed fracture. No acute fracture, subluxation or dislocation. No radiopaque foreign bodies. IMPRESSION: No acute fracture or foreign body. Electronically Signed   By: Charlett Nose M.D.   On: 04/10/2018 02:25    ____________________________________________   PROCEDURES   .Marland KitchenLaceration Repair Date/Time: 04/10/2018 6:24 AM Performed by: Darci Current, MD Authorized by: Darci Current, MD   Consent:    Consent obtained:  Verbal   Consent given by:  Patient   Risks discussed:  Infection, pain, retained foreign body, poor cosmetic result and poor wound healing Anesthesia (see MAR for exact dosages):    Anesthesia method:  Local infiltration   Local anesthetic:  Lidocaine 1% w/o epi Laceration details:    Location:  Hand   Hand location:  R hand, dorsum   Length (cm):  14   Depth (mm):  15 Repair type:    Repair type:  Simple Exploration:    Hemostasis achieved with:  Direct pressure   Wound exploration: entire depth of wound probed and visualized     Contaminated: no   Treatment:    Area cleansed with:  Saline   Amount of cleaning:  Extensive   Irrigation solution:  Sterile saline   Visualized foreign bodies/material removed: no   Skin repair:    Repair method:  Sutures   Suture size:  4-0   Suture material:  Nylon   Suture technique:  Simple interrupted    Number of sutures:  15 Approximation:    Approximation:  Close Post-procedure details:    Dressing:  Sterile dressing   Patient tolerance of procedure:  Tolerated well, no immediate complications     ____________________________________________   INITIAL IMPRESSION / ASSESSMENT AND PLAN / ED COURSE  As part of my medical decision making, I reviewed the following data within the electronic MEDICAL RECORD NUMBER   47 year old male presenting with above-stated history and physical exam secondary to accidental laceration to the dorsal aspect of his right hand.  Wound repaired without any difficulty. ____________________________________________  FINAL CLINICAL IMPRESSION(S) / ED DIAGNOSES  Final diagnoses:  Laceration of right hand without foreign body, initial encounter     MEDICATIONS GIVEN DURING THIS VISIT:  Medications  lidocaine (PF) (XYLOCAINE) 1 % injection (has no administration in time range)  oxyCODONE-acetaminophen (PERCOCET/ROXICET) 5-325 MG per tablet 1 tablet (1 tablet Oral Given 04/10/18 0221)     ED  Discharge Orders    None       Note:  This document was prepared using Dragon voice recognition software and may include unintentional dictation errors.    Darci CurrentBrown, [place] N, MD 04/10/18 [phone]

## 2018-04-10 NOTE — ED Triage Notes (Signed)
Pt has a laceration to top of right hand and right 3rd finger.  Pt states a skill saw blade cut hand tonight.  Pt alert.

## 2018-04-18 ENCOUNTER — Emergency Department
Admission: EM | Admit: 2018-04-18 | Discharge: 2018-04-18 | Disposition: A | Discharge status: Home / Self Care | Payer: Medicaid Other | Attending: Emergency Medicine | Admitting: Emergency Medicine

## 2018-04-18 ENCOUNTER — Other Ambulatory Visit: Payer: Self-pay

## 2018-04-18 ENCOUNTER — Encounter: Payer: Self-pay | Admitting: Emergency Medicine

## 2018-04-18 DIAGNOSIS — Z87891 Personal history of nicotine dependence: Secondary | ICD-10-CM | POA: Insufficient documentation

## 2018-04-18 DIAGNOSIS — Z9049 Acquired absence of other specified parts of digestive tract: Secondary | ICD-10-CM | POA: Insufficient documentation

## 2018-04-18 DIAGNOSIS — Z5189 Encounter for other specified aftercare: Secondary | ICD-10-CM

## 2018-04-18 DIAGNOSIS — X58XXXD Exposure to other specified factors, subsequent encounter: Secondary | ICD-10-CM | POA: Insufficient documentation

## 2018-04-18 DIAGNOSIS — S61411D Laceration without foreign body of right hand, subsequent encounter: Secondary | ICD-10-CM | POA: Insufficient documentation

## 2018-04-18 MED ORDER — OXYCODONE-ACETAMINOPHEN 7.5-325 MG PO TABS: 1.0000 | ORAL_TABLET | Freq: Four times a day (QID) | ORAL | 0 refills | Status: DC | PRN

## 2018-04-18 NOTE — ED Notes (Signed)
Finger splint applied to right ring finger and buddy taped to middle finger. Wrapped with gauze, wound covered with non-stick dressing. DC instructions discussed pt verbalized understanding to return in 5-6 days for suture removal. Pt also instructed to find PCP to follow up on elevated BP readings.  Pt given booklet for free and low cost healthcare in [employer].

## 2018-04-18 NOTE — ED Provider Notes (Signed)
[facility] Emergency Department Provider Note  ____________________________________________   First MD Initiated Contact with Patient 04/18/18 1131     (approximate)  I have reviewed the triage vital signs and the nursing notes.   HISTORY  Chief Complaint Suture / Staple Removal   HPI Brian Conner is a 47 y.o. male patient presents to the ED with the understanding that he was having sutures removed today.  Patient states that he believes to his sutures have already popped.  He was seen approximately 7 days ago and sutured in the emergency department.  He continues to take Augmentin without any difficulties.  He rates his pain as a 7 out of 10.  He states he continues to have pain in the area of his laceration.   History reviewed. No pertinent past medical history.  There are no active problems to display for this patient.   Past Surgical History:  Procedure Laterality Date  . CHOLECYSTECTOMY      Prior to Admission medications   Medication Sig Start Date End Date Taking? Authorizing Provider  amoxicillin-clavulanate (AUGMENTIN) 875-125 MG tablet Take 1 tablet by mouth 2 (two) times daily for 10 days. 04/10/18 04/20/18  Darci Current, MD  dibucaine (NUPERCAINAL) 1 % OINT Place 1 application rectally as needed for hemorrhoids. 01/30/18   Willy Eddy, MD  oxyCODONE-acetaminophen (PERCOCET) 7.5-325 MG tablet Take 1 tablet by mouth every 6 (six) hours as needed for severe pain. 04/18/18   Bridget Hartshorn L, PA-C  polyethylene glycol (MIRALAX / GLYCOLAX) packet Take 17 g by mouth daily. Mix one tablespoon with 8oz of your favorite juice or water every day until you are having soft formed stools. Then start taking once daily if you didn't have a stool the day before. 01/30/18   Willy Eddy, MD    Allergies Ibuprofen and Tramadol  History reviewed. No pertinent family history.  Social History Social History   Tobacco Use  . Smoking status:  Former Smoker    Packs/day: 15.00    Types: Cigarettes  . Smokeless tobacco: Never Used  Substance Use Topics  . Alcohol use: Yes    Comment: seldom  . Drug use: Never    Review of Systems Constitutional: No fever/chills Cardiovascular: Denies chest pain. Respiratory: Denies shortness of breath. Musculoskeletal: Positive for  right hand pain. Skin: Positive for healing laceration. Neurological: Negative for headaches, focal weakness or numbness. ____________________________________________   PHYSICAL EXAM:  VITAL SIGNS: ED Triage Vitals  Enc Vitals Group     BP 04/18/18 1131 (!) 155/104     Pulse Rate 04/18/18 1131 80     Resp 04/18/18 1131 16     Temp 04/18/18 1131 (!) 97.4 F (36.3 C)     Temp Source 04/18/18 1131 Oral     SpO2 04/18/18 1131 96 %     Weight 04/18/18 1128 279 lb 15.8 oz (127 kg)     Height 04/18/18 1128 5\' 11"  (1.803 m)     Head Circumference --      Peak Flow --      Pain Score 04/18/18 1128 7     Pain Loc --      Pain Edu? --      Excl. in GC? --    Constitutional: Alert and oriented. Well appearing and in no acute distress. Eyes: Conjunctivae are normal.  Head: Atraumatic. Neck: No stridor.   Cardiovascular: Normal rate, regular rhythm. Grossly normal heart sounds.  Good peripheral circulation. Respiratory: Normal respiratory  effort.  No retractions. Lungs CTAB. Musculoskeletal: Examination of the right hand dorsal aspect there is a healing laceration which has to sutures that have broken.  There is some erythema in the area but no drainage present.  Area is tender to palpation.  Patient is still able to flex and extend his digits without any difficulties. Neurologic:  Normal speech and language. No gross focal neurologic deficits are appreciated. Skin:  Skin is warm, dry.  Healing laceration with wound dehiscence. Psychiatric: Mood and affect are normal. Speech and behavior are normal.  ____________________________________________    LABS (all labs ordered are listed, but only abnormal results are displayed)  Labs Reviewed - No data to display  PROCEDURES  Procedure(s) performed: None  Procedures  Critical Care performed: No  ____________________________________________   INITIAL IMPRESSION / ASSESSMENT AND PLAN / ED COURSE  As part of my medical decision making, I reviewed the following data within the electronic MEDICAL RECORD NUMBER Notes from prior ED visits and [place] Controlled Substance Database  Patient presents to the ED for wound check and possible suture removal however area has not healed completely and one area sutures have broken causing the wound to dehisce.  There is no drainage from the area.  There is tenderness to palpation surrounding this area.  This laceration occurred from a skill saw.  Patient was given a prescription for Percocet every 6 hours as needed for pain.  He is to return to the emergency department in 5 to 6 days for suture removal.  He was placed in a splint to avoid stressing the area.  ____________________________________________   FINAL CLINICAL IMPRESSION(S) / ED DIAGNOSES  Final diagnoses:  Encounter for wound re-check     ED Discharge Orders         Ordered    oxyCODONE-acetaminophen (PERCOCET) 7.5-325 MG tablet  Every 6 hours PRN     04/18/18 1209           Note:  This document was prepared using Dragon voice recognition software and may include unintentional dictation errors.    Tommi Rumps, PA-C 04/18/18 1519    Governor Rooks, MD 04/20/18 [phone]

## 2018-04-18 NOTE — ED Triage Notes (Addendum)
Here to have stitches removed. Wound appears open but does not seem to have sx infection (pus, fever, new redness). Pt denies problems. Sutures over joint, have been in place 7 days

## 2018-04-18 NOTE — Discharge Instructions (Signed)
Return to the emergency department in 5 to 6 days for suture removal.  Keep area clean and dry.  Change dressing daily and clean with mild soap and water.  Continue pain medication only as needed.  Do not drive or operate machinery while taking this medication.

## 2018-04-18 NOTE — ED Notes (Signed)
See triage note  Presents for suture removal to right hand  Some of sutures intact  States he thinks that he lost 2 sutures  Small area of cut open

## 2018-04-25 ENCOUNTER — Encounter: Payer: Self-pay | Admitting: Emergency Medicine

## 2018-04-25 ENCOUNTER — Other Ambulatory Visit: Payer: Self-pay

## 2018-04-25 ENCOUNTER — Emergency Department
Admission: EM | Admit: 2018-04-25 | Discharge: 2018-04-25 | Disposition: A | Discharge status: Home / Self Care | Payer: Self-pay | Attending: Emergency Medicine | Admitting: Emergency Medicine

## 2018-04-25 DIAGNOSIS — Y818 Miscellaneous general- and plastic-surgery devices associated with adverse incidents, not elsewhere classified: Secondary | ICD-10-CM | POA: Insufficient documentation

## 2018-04-25 DIAGNOSIS — T8131XD Disruption of external operation (surgical) wound, not elsewhere classified, subsequent encounter: Secondary | ICD-10-CM | POA: Insufficient documentation

## 2018-04-25 DIAGNOSIS — Z4802 Encounter for removal of sutures: Secondary | ICD-10-CM | POA: Insufficient documentation

## 2018-04-25 DIAGNOSIS — Z87891 Personal history of nicotine dependence: Secondary | ICD-10-CM | POA: Insufficient documentation

## 2018-04-25 NOTE — ED Provider Notes (Signed)
[facility] Emergency Department Provider Note  ____________________________________________   First MD Initiated Contact with Patient 04/25/18 1307     (approximate)  I have reviewed the triage vital signs and the nursing notes.   HISTORY  Chief Complaint Suture / Staple Removal  HPI Brian Conner is a 47 y.o. male presents to the ED for suture removal.  Patient seen in the ED on 04/10/2018 for laceration to his right hand.  He denies any drainage or evidence of infection.  Area has dehisced.  Patient rates his pain as 7 out of 10 when he is making a fist.  History reviewed. No pertinent past medical history.  There are no active problems to display for this patient.   Past Surgical History:  Procedure Laterality Date  . CHOLECYSTECTOMY      Prior to Admission medications   Medication Sig Start Date End Date Taking? Authorizing Provider  dibucaine (NUPERCAINAL) 1 % OINT Place 1 application rectally as needed for hemorrhoids. 01/30/18   Willy Eddy, MD  polyethylene glycol [facility] / Ethelene Hal) packet Take 17 g by mouth daily. Mix one tablespoon with 8oz of your favorite juice or water every day until you are having soft formed stools. Then start taking once daily if you didn't have a stool the day before. 01/30/18   Willy Eddy, MD    Allergies Ibuprofen and Tramadol  No family history on file.  Social History Social History   Tobacco Use  . Smoking status: Former Smoker    Packs/day: 15.00    Types: Cigarettes  . Smokeless tobacco: Never Used  Substance Use Topics  . Alcohol use: Yes    Comment: seldom  . Drug use: Never    Review of Systems Constitutional: No fever/chills Cardiovascular: Denies chest pain. Respiratory: Denies shortness of breath. Gastrointestinal: No abdominal pain.  No nausea, no vomiting.   Musculoskeletal: Negative for back pain. Skin: Wound dehiscence to the right hand. Neurological: Negative for  headaches, focal weakness or numbness. ____________________________________________   PHYSICAL EXAM:  VITAL SIGNS: ED Triage Vitals  Enc Vitals Group     BP 04/25/18 1252 (!) 147/87     Pulse Rate 04/25/18 1252 66     Resp 04/25/18 1252 16     Temp 04/25/18 1252 98 F (36.7 C)     Temp Source 04/25/18 1252 Oral     SpO2 04/25/18 1252 95 %     Weight 04/25/18 1237 279 lb 15.8 oz (127 kg)     Height 04/25/18 1237 5\' 11"  (1.803 m)     Head Circumference --      Peak Flow --      Pain Score 04/25/18 1237 7     Pain Loc --      Pain Edu? --      Excl. in GC? --    Constitutional: Alert and oriented. Well appearing and in no acute distress. Eyes: Conjunctivae are normal.  Head: Atraumatic. Nose: No congestion/rhinnorhea. Neck: No stridor.   Cardiovascular: Normal rate, regular rhythm. Grossly normal heart sounds.  Good peripheral circulation. Respiratory: Normal respiratory effort.  No retractions. Lungs CTAB. Musculoskeletal: No lower extremity tenderness nor edema.  No joint effusions. Neurologic:  Normal speech and language. No gross focal neurologic deficits are appreciated. No gait instability. Skin:  Skin is warm, dry and intact. No rash noted. Psychiatric: Mood and affect are normal. Speech and behavior are normal.  ____________________________________________   LABS (all labs ordered are listed, but only  abnormal results are displayed)  Labs Reviewed - No data to display ____________________________________________   PROCEDURES  Procedure(s) performed: 8 sutures were removed by Keith Harris ED tech.  Procedures  CritAsencion Partridgeical Care performed: No  ____________________________________________   INITIAL IMPRESSION / ASSESSMENT AND PLAN / ED COURSE  As part of my medical decision making, I reviewed the following data within the electronic MEDICAL RECORD NUMBER Notes from prior ED visits and [place] Controlled Substance Database  Patient returns to the ED for suture removal.   I saw patient last week and wound was dehiscing with several sutures broken and not present when he was seen last week.  Area appears to be healing without any signs of infection but has opened and more sutures have broken loose.  The remaining sutures were removed.  Patient was instructed to keep area clean and dry and watch for any signs of infection.  He is also encouraged to clean with mild soap and water daily.  He will return to the emergency department or go to urgent care if any continued concerns.  ___________________________________________   FINAL CLINICAL IMPRESSION(S) / ED DIAGNOSES  Final diagnoses:  Encounter for removal of sutures  Disruption or dehiscence of closure of skin, subsequent encounter     ED Discharge Orders    None       Note:  This document was prepared using Dragon voice recognition software and may include unintentional dictation errors.   Tommi RumpsSummers, Chaneka Trefz L, PA-C 04/25/18 1651  Jeanmarie PlantMcShane, James A, MD 04/26/18 [phone]

## 2018-04-25 NOTE — Discharge Instructions (Addendum)
Follow-[facility] with your primary care provider or [facility] if any continued problems.  Continue cleaning the area with mild soap and water and allowed to dry completely.  Cover area when working to avoid getting infected.  Take Tylenol or ibuprofen as needed for pain.

## 2018-04-25 NOTE — ED Triage Notes (Signed)
Pt comes into the ED via POV c./o suture removal.  Patient still having pain around the site.  Patient in NAD at this time.

## 2018-04-25 NOTE — ED Notes (Signed)
See triage note  Presents for suture removal to right hand  Incision area open no drainage noted  Min swelling

## 2018-09-08 ENCOUNTER — Other Ambulatory Visit: Payer: Self-pay

## 2018-09-08 ENCOUNTER — Emergency Department: Payer: Medicaid Other

## 2018-09-08 ENCOUNTER — Emergency Department
Admission: EM | Admit: 2018-09-08 | Discharge: 2018-09-08 | Disposition: A | Discharge status: Home / Self Care | Payer: Medicaid Other | Attending: Emergency Medicine | Admitting: Emergency Medicine

## 2018-09-08 DIAGNOSIS — Y9389 Activity, other specified: Secondary | ICD-10-CM | POA: Diagnosis not present

## 2018-09-08 DIAGNOSIS — S60471A Other superficial bite of left index finger, initial encounter: Secondary | ICD-10-CM | POA: Insufficient documentation

## 2018-09-08 DIAGNOSIS — Y998 Other external cause status: Secondary | ICD-10-CM | POA: Diagnosis not present

## 2018-09-08 DIAGNOSIS — F17210 Nicotine dependence, cigarettes, uncomplicated: Secondary | ICD-10-CM | POA: Insufficient documentation

## 2018-09-08 DIAGNOSIS — W5501XA Bitten by cat, initial encounter: Secondary | ICD-10-CM | POA: Diagnosis not present

## 2018-09-08 DIAGNOSIS — Y929 Unspecified place or not applicable: Secondary | ICD-10-CM | POA: Insufficient documentation

## 2018-09-08 DIAGNOSIS — S50872A Other superficial bite of left forearm, initial encounter: Secondary | ICD-10-CM | POA: Diagnosis present

## 2018-09-08 DIAGNOSIS — S61452A Open bite of left hand, initial encounter: Secondary | ICD-10-CM

## 2018-09-08 DIAGNOSIS — Z9049 Acquired absence of other specified parts of digestive tract: Secondary | ICD-10-CM | POA: Insufficient documentation

## 2018-09-08 MED ORDER — AMOXICILLIN-POT CLAVULANATE 875-125 MG PO TABS
1.0000 | ORAL_TABLET | Freq: Once | ORAL | Status: AC
  Administered 2018-09-08: 1 via ORAL
  Filled 2018-09-08: qty 1

## 2018-09-08 MED ORDER — OXYCODONE HCL 5 MG PO TABS: 5.0000 mg | ORAL_TABLET | Freq: Four times a day (QID) | ORAL | 0 refills | Status: AC | PRN

## 2018-09-08 MED ORDER — OXYCODONE-ACETAMINOPHEN 5-325 MG PO TABS
1.0000 | ORAL_TABLET | Freq: Once | ORAL | Status: AC
  Administered 2018-09-08: 1 via ORAL
  Filled 2018-09-08: qty 1

## 2018-09-08 MED ORDER — AMPICILLIN-SULBACTAM SODIUM 3 (2-1) G IJ SOLR
3.0000 g | Freq: Once | INTRAMUSCULAR | Status: AC
  Administered 2018-09-08: 3 g via INTRAVENOUS
  Filled 2018-09-08: qty 3

## 2018-09-08 MED ORDER — AMOXICILLIN-POT CLAVULANATE 875-125 MG PO TABS: 1.0000 | ORAL_TABLET | Freq: Two times a day (BID) | ORAL | 0 refills | Status: AC

## 2018-09-08 NOTE — ED Triage Notes (Signed)
Pt to the ER for cat bites over both arms and hands. Pt states his room mates dog got after his 30lb cat. Pt picked up the cat to keep it away from dog and the cat "freaked out" and bit and scratched the pt. Pt has pain to the 2nd digit of the left hand and decreased ROM. Pt has wounds over the 2nd digit of the left hand, left arm, and right arm . Pt used peroxide, neosporin and bandages at home.

## 2018-09-08 NOTE — ED Notes (Signed)
Provider with pt att.  C/O attack by his own cat after the pt attempted to hold cat while cat was disturbed by a dog.  Pt with approx 19 scratch and bite marks to both lower arms and hands, bleeding controlled and cleaned and bandaged.  Pt c/o pain at left first knuckle of first finger that "feels like it's spreading", area appears red and swollen.  CMS intact to all finger tips.  Pt counseled on animal control role in bites, reports indoor cat that is up-to-date on vaccinations

## 2018-09-08 NOTE — ED Provider Notes (Signed)
[facility] EMERGENCY DEPARTMENT Provider Note   CSN: [id number] Arrival date & time: 09/08/18  2152     History   Chief Complaint Chief Complaint  Patient presents with  . Animal Bite    HPI Brian Conner is a 48 y.o. male.  Presents to the emergency department evaluation of multiple cat bites and scratches to his left and right forearm as well as his left hand.  He is not complaining of any pain except for his left index finger along the MCP joint.  He describes pain and swelling that is moderate to severe.  His tetanus is up-to-date.  Cat bite occurred just prior to arrival.  He states his cat was being attacked by a friend's dog.  Cat's vaccinations are up-to-date.  Animal control has been notified.  Patient is not any medications for pain.  HPI  History reviewed. No pertinent past medical history.  There are no active problems to display for this patient.   Past Surgical History:  Procedure Laterality Date  . CHOLECYSTECTOMY          Home Medications    Prior to Admission medications   Medication Sig Start Date End Date Taking? Authorizing Provider  amoxicillin-clavulanate (AUGMENTIN) 875-125 MG tablet Take 1 tablet by mouth every 12 (twelve) hours for 10 days. 09/08/18 09/18/18  Evon SlackGaines, Shontay Wallner C, PA-C  dibucaine (NUPERCAINAL) 1 % OINT Place 1 application rectally as needed for hemorrhoids. 01/30/18   Willy Eddyobinson, Patrick, MD  oxyCODONE (ROXICODONE) 5 MG immediate release tablet Take 1-2 tablets (5-10 mg total) by mouth every 6 (six) hours as needed. 09/08/18 09/08/19  Evon SlackGaines, Koleen Celia C, PA-C  polyethylene glycol (MIRALAX / GLYCOLAX) packet Take 17 g by mouth daily. Mix one tablespoon with 8oz of your favorite juice or water every day until you are having soft formed stools. Then start taking once daily if you didn't have a stool the day before. 01/30/18   Willy Eddyobinson, Patrick, MD    Family History No family history on file.  Social History Social History     Tobacco Use  . Smoking status: Current Every Day Smoker    Packs/day: 15.00    Types: Cigarettes  . Smokeless tobacco: Never Used  Substance Use Topics  . Alcohol use: Yes    Comment: seldom  . Drug use: Never     Allergies   Ibuprofen and Tramadol   Review of Systems Review of Systems  Constitutional: Negative for chills and fever.  Gastrointestinal: Negative for nausea and vomiting.  Musculoskeletal: Positive for myalgias. Negative for joint swelling.  Skin: Positive for wound.     Physical Exam Updated Vital Signs BP (!) 135/98 (BP Location: Left Arm)   Pulse 86   Temp 97.7 F (36.5 C) (Oral)   Resp 16   SpO2 97%   Physical Exam Constitutional:      Appearance: He is well-developed.  HENT:     Head: Normocephalic and atraumatic.  Eyes:     Conjunctiva/sclera: Conjunctivae normal.  Neck:     Musculoskeletal: Normal range of motion.  Cardiovascular:     Rate and Rhythm: Normal rate.  Pulmonary:     Effort: Pulmonary effort is normal. No respiratory distress.  Musculoskeletal: Normal range of motion.     Comments: Examination of both upper extremities show multiple abrasions and small 1 to 2 mm puncture wounds to the forearms.  He has no pain or tenderness or swelling warmth or redness throughout the forearms.  Patient has  small barely visible nonbleeding puncture wounds along with small abrasions to the left index finger on the volar and dorsal aspect of the MCP joint as well as on the volar and dorsal aspect of the proximal phalanx.  He has a couple abrasions or small puncture wounds to the volar aspect of the first webspace.  Patient is tender palpation along the proximal phalanx as well as the MCP joint of the left index finger.  There is no open or gaping wounds throughout both upper extremities.  Sensation is intact distally.  No signs of cellulitis, purulent drainage, warmth or redness.  Skin:    General: Skin is warm.     Findings: No rash.   Neurological:     Mental Status: He is alert and oriented to person, place, and time.  Psychiatric:        Behavior: Behavior normal.        Thought Content: Thought content normal.      ED Treatments / Results  Labs (all labs ordered are listed, but only abnormal results are displayed) Labs Reviewed - No data to display  EKG None  Radiology No results found.  Procedures Procedures (including critical care time)  Medications Ordered in ED Medications  Ampicillin-Sulbactam (UNASYN) 3 g in sodium chloride 0.9 % 100 mL IVPB (has no administration in time range)  oxyCODONE-acetaminophen (PERCOCET/ROXICET) 5-325 MG per tablet 1 tablet (1 tablet Oral Given 09/08/18 2231)  amoxicillin-clavulanate (AUGMENTIN) 875-125 MG per tablet 1 tablet (1 tablet Oral Given 09/08/18 2231)  oxyCODONE-acetaminophen (PERCOCET/ROXICET) 5-325 MG per tablet 1 tablet (1 tablet Oral Given 09/08/18 2258)   Imaging: X-rays of the left hand ordered and reviewed by me today show no evidence of acute bony abnormality throughout the left hand.  No sign of foreign body or fracture.  Patient has evidence of open reduction internal fixation with plate and screws to the middle phalanx of the left ring finger.  No significant soft tissue swelling noted.   Initial Impression / Assessment and Plan / ED Course  I have reviewed the triage vital signs and the nursing notes.  Pertinent labs & imaging results that were available during my care of the patient were reviewed by me and considered in my medical decision making (see chart for details).     48 year old male with multiple cat scratches and cat bites to the upper extremities most concerning for cat bite to the left index finger.  Patient's tetanus is up-to-date.  Vaccinations of cat are up-to-date.  Animal control notified.  X-rays show no foreign body or fracture.  Patient given 3 g of Unasyn due to possible joint penetration.  No signs of infection on exam today.   No signs of tenosynovitis.  He will be sent home on oral Augmentin and will follow-up and 24 hours for recheck.  Patient understands signs to return to the emergency department for sooner.  He will soak finger daily.  He will apply antibiotic ointment to all other abrasions and small puncture wounds.    Final Clinical Impressions(s) / ED Diagnoses   Final diagnoses:  Cat bite, initial encounter  Cat bite of left hand, initial encounter    ED Discharge Orders         Ordered    oxyCODONE (ROXICODONE) 5 MG immediate release tablet  Every 6 hours PRN     09/08/18 2248    amoxicillin-clavulanate (AUGMENTIN) 875-125 MG tablet  Every 12 hours     09/08/18 2248  Evon SlackGaines, Lane Eland C, PA-C 09/08/18 2305    Nita SickleVeronese, [place], MD 09/09/18 [phone]

## 2018-09-08 NOTE — ED Notes (Signed)
Peripheral IV discontinued. Catheter intact. No signs of infiltration or redness. Gauze applied to IV site.    Discharge instructions reviewed with patient. Questions fielded by this RN. Patient verbalizes understanding of instructions. Patient discharged home in stable condition per gaines. No acute distress noted at time of discharge.

## 2018-09-08 NOTE — Discharge Instructions (Addendum)
Please soak left hand in half hydrogen peroxide and half tap water for 15 minutes 3 times daily.  Take antibiotics as prescribed.  Take pain medication as needed for severe pain.  Return to the emergency department in 24 hours for recheck of your left hand.  Apply antibiotic ointment to all abrasions on both arms daily.  If any increasing pain swelling or fevers return to the emergency department sooner.

## 2018-09-28 ENCOUNTER — Emergency Department
Admission: EM | Admit: 2018-09-28 | Discharge: 2018-09-28 | Disposition: A | Discharge status: Home / Self Care | Payer: Medicaid Other | Attending: Emergency Medicine | Admitting: Emergency Medicine

## 2018-09-28 ENCOUNTER — Encounter: Payer: Self-pay | Admitting: Emergency Medicine

## 2018-09-28 ENCOUNTER — Other Ambulatory Visit: Payer: Self-pay

## 2018-09-28 DIAGNOSIS — Y939 Activity, unspecified: Secondary | ICD-10-CM | POA: Diagnosis not present

## 2018-09-28 DIAGNOSIS — X58XXXA Exposure to other specified factors, initial encounter: Secondary | ICD-10-CM | POA: Insufficient documentation

## 2018-09-28 DIAGNOSIS — Y999 Unspecified external cause status: Secondary | ICD-10-CM | POA: Diagnosis not present

## 2018-09-28 DIAGNOSIS — Y929 Unspecified place or not applicable: Secondary | ICD-10-CM | POA: Insufficient documentation

## 2018-09-28 DIAGNOSIS — F17210 Nicotine dependence, cigarettes, uncomplicated: Secondary | ICD-10-CM | POA: Insufficient documentation

## 2018-09-28 DIAGNOSIS — K0889 Other specified disorders of teeth and supporting structures: Secondary | ICD-10-CM

## 2018-09-28 DIAGNOSIS — K029 Dental caries, unspecified: Secondary | ICD-10-CM | POA: Insufficient documentation

## 2018-09-28 DIAGNOSIS — Z79899 Other long term (current) drug therapy: Secondary | ICD-10-CM | POA: Diagnosis not present

## 2018-09-28 DIAGNOSIS — S025XXA Fracture of tooth (traumatic), initial encounter for closed fracture: Secondary | ICD-10-CM | POA: Diagnosis not present

## 2018-09-28 MED ORDER — OXYCODONE-ACETAMINOPHEN 7.5-325 MG PO TABS: 1.0000 | ORAL_TABLET | Freq: Four times a day (QID) | ORAL | 0 refills | Status: DC | PRN

## 2018-09-28 MED ORDER — AMOXICILLIN 500 MG PO CAPS: 500.0000 mg | ORAL_CAPSULE | Freq: Three times a day (TID) | ORAL | 0 refills | Status: DC

## 2018-09-28 MED ORDER — LIDOCAINE VISCOUS HCL 2 % MT SOLN: 5.0000 mL | Freq: Four times a day (QID) | OROMUCOSAL | 0 refills | Status: DC | PRN

## 2018-09-28 MED ORDER — OXYCODONE-ACETAMINOPHEN 5-325 MG PO TABS
1.0000 | ORAL_TABLET | Freq: Once | ORAL | Status: AC
  Administered 2018-09-28: 1 via ORAL
  Filled 2018-09-28: qty 1

## 2018-09-28 MED ORDER — LIDOCAINE VISCOUS HCL 2 % MT SOLN
15.0000 mL | Freq: Once | OROMUCOSAL | Status: AC
  Administered 2018-09-28: 15 mL via OROMUCOSAL
  Filled 2018-09-28: qty 15

## 2018-09-28 NOTE — ED Provider Notes (Signed)
[facility] Emergency Department Provider Note   ____________________________________________   First MD Initiated Contact with Patient 09/28/18 1344     (approximate)  I have reviewed the triage vital signs and the nursing notes.   HISTORY  Chief Complaint Dental Pain    HPI SAIR Brian Conner is a 48 y.o. male   patient presents with left upper dental pain for 3 days.  Patient has history of poor dental hygiene.  Patient has seen dentist for this complaint.  Patient rates the pain as a 9/10.  Patient described the pain is "achy".  Patient no relief with Tylenol.   History reviewed. No pertinent past medical history.  There are no active problems to display for this patient.   Past Surgical History:  Procedure Laterality Date  . CHOLECYSTECTOMY      Prior to Admission medications   Medication Sig Start Date End Date Taking? Authorizing Provider  amoxicillin (AMOXIL) 500 MG capsule Take 1 capsule (500 mg total) by mouth 3 (three) times daily. 09/28/18   Joni Reining, PA-C  dibucaine (NUPERCAINAL) 1 % OINT Place 1 application rectally as needed for hemorrhoids. 01/30/18   Willy Eddy, MD  lidocaine (XYLOCAINE) 2 % solution Use as directed 5 mLs in the mouth or throat every 6 (six) hours as needed for mouth pain. 09/28/18   Joni Reining, PA-C  oxyCODONE (ROXICODONE) 5 MG immediate release tablet Take 1-2 tablets (5-10 mg total) by mouth every 6 (six) hours as needed. 09/08/18 09/08/19  Evon Slack, PA-C  oxyCODONE-acetaminophen (PERCOCET) 7.5-325 MG tablet Take 1 tablet by mouth every 6 (six) hours as needed for severe pain. 09/28/18   Joni Reining, PA-C  polyethylene glycol [facility] / Ethelene Hal) packet Take 17 g by mouth daily. Mix one tablespoon with 8oz of your favorite juice or water every day until you are having soft formed stools. Then start taking once daily if you didn't have a stool the day before. 01/30/18   Willy Eddy, MD     Allergies Ibuprofen and Tramadol  History reviewed. No pertinent family history.  Social History Social History   Tobacco Use  . Smoking status: Current Every Day Smoker    Packs/day: 0.50    Types: Cigarettes  . Smokeless tobacco: Never Used  Substance Use Topics  . Alcohol use: Yes    Comment: seldom  . Drug use: Never    Review of Systems Constitutional: No fever/chills Eyes: No visual changes. ENT: No sore throat.  Dental pain. Cardiovascular: Denies chest pain. Respiratory: Denies shortness of breath. Gastrointestinal: No abdominal pain.  No nausea, no vomiting.  No diarrhea.  No constipation. Genitourinary: Negative for dysuria. Musculoskeletal: Negative for back pain. Skin: Negative for rash. Neurological: Negative for headaches, focal weakness or numbness. Allergic/Immunilogical: Tramadol/ ibuprofen ____________________________________________   PHYSICAL EXAM:  VITAL SIGNS: ED Triage Vitals  Enc Vitals Group     BP --      Pulse Rate 09/28/18 1331 72     Resp 09/28/18 1331 18     Temp 09/28/18 1331 (!) 97.5 F (36.4 C)     Temp Source 09/28/18 1331 Oral     SpO2 09/28/18 1331 97 %     Weight 09/28/18 1332 295 lb (133.8 kg)     Height 09/28/18 1332 5\' 11"  (1.803 m)     Head Circumference --      Peak Flow --      Pain Score 09/28/18 1332 9  Pain Loc --      Pain Edu? --      Excl. in GC? --     Constitutional: Alert and oriented. Well appearing and in no acute distress. Mouth/Throat: Mucous membranes are moist.  Oropharynx non-erythematous.  Edematous gingiva and multiple devitalized for left and lower molars. Cardiovascular: Normal rate, regular rhythm. Grossly normal heart sounds.  Good peripheral circulation. Respiratory: Normal respiratory effort.  No retractions. Lungs CTAB. Skin:  Skin is warm, dry and intact. No rash noted. Psychiatric: Mood and affect are normal. Speech and behavior are  normal.  ____________________________________________   LABS (all labs ordered are listed, but only abnormal results are displayed)  Labs Reviewed - No data to display ____________________________________________  EKG   ____________________________________________  RADIOLOGY  ED MD interpretation:    Official radiology report(s): No results found.  ____________________________________________   PROCEDURES  Procedure(s) performed: None  Procedures  Critical Care performed: No  ____________________________________________   INITIAL IMPRESSION / ASSESSMENT AND PLAN / ED COURSE  As part of my medical decision making, I reviewed the following data within the electronic MEDICAL RECORD NUMBER     Dental pain secondary to multiple caries and fractured teeth.  Patient given discharge care instruction list of dental clinics for follow-up care.  Take medication as directed.      ____________________________________________   FINAL CLINICAL IMPRESSION(S) / ED DIAGNOSES  Final diagnoses:  Toothache     ED Discharge Orders         Ordered    oxyCODONE-acetaminophen (PERCOCET) 7.5-325 MG tablet  Every 6 hours PRN     09/28/18 1351    amoxicillin (AMOXIL) 500 MG capsule  3 times daily     09/28/18 1351    lidocaine (XYLOCAINE) 2 % solution  Every 6 hours PRN     09/28/18 1351           Note:  This document was prepared using Dragon voice recognition software and may include unintentional dictation errors.    Joni Reining, PA-C 09/28/18 1358    Dionne Bucy, MD 09/28/18 [phone]

## 2018-09-28 NOTE — ED Triage Notes (Signed)
Pt presents to ED via POV c/o upper left dental pain x3 days. Has not seen dentist.

## 2018-09-28 NOTE — Discharge Instructions (Signed)
Follow-up from list of dental clinics provided with your discharge.

## 2018-10-14 ENCOUNTER — Emergency Department
Admission: EM | Admit: 2018-10-14 | Discharge: 2018-10-14 | Disposition: A | Discharge status: Home / Self Care | Payer: Medicaid Other | Attending: Emergency Medicine | Admitting: Emergency Medicine

## 2018-10-14 ENCOUNTER — Other Ambulatory Visit: Payer: Self-pay

## 2018-10-14 ENCOUNTER — Encounter: Payer: Self-pay | Admitting: Emergency Medicine

## 2018-10-14 DIAGNOSIS — Z79899 Other long term (current) drug therapy: Secondary | ICD-10-CM | POA: Insufficient documentation

## 2018-10-14 DIAGNOSIS — K047 Periapical abscess without sinus: Secondary | ICD-10-CM | POA: Insufficient documentation

## 2018-10-14 DIAGNOSIS — F17210 Nicotine dependence, cigarettes, uncomplicated: Secondary | ICD-10-CM | POA: Insufficient documentation

## 2018-10-14 DIAGNOSIS — K0889 Other specified disorders of teeth and supporting structures: Secondary | ICD-10-CM | POA: Diagnosis present

## 2018-10-14 MED ORDER — HYDROCODONE-ACETAMINOPHEN 5-325 MG PO TABS
1.0000 | ORAL_TABLET | Freq: Once | ORAL | Status: AC
  Administered 2018-10-14: 1 via ORAL
  Filled 2018-10-14: qty 1

## 2018-10-14 MED ORDER — CLINDAMYCIN HCL 300 MG PO CAPS: 300.0000 mg | ORAL_CAPSULE | Freq: Four times a day (QID) | ORAL | 0 refills | Status: DC

## 2018-10-14 MED ORDER — HYDROCODONE-ACETAMINOPHEN 5-325 MG PO TABS: 1.0000 | ORAL_TABLET | ORAL | 0 refills | Status: DC | PRN

## 2018-10-14 MED ORDER — CHLORHEXIDINE GLUCONATE 0.12 % MT SOLN: 10.0000 mL | Freq: Two times a day (BID) | OROMUCOSAL | 0 refills | Status: DC

## 2018-10-14 MED ORDER — LIDOCAINE VISCOUS HCL 2 % MT SOLN: 10.0000 mL | OROMUCOSAL | 0 refills | Status: DC | PRN

## 2018-10-14 MED ORDER — CLINDAMYCIN HCL 150 MG PO CAPS
300.0000 mg | ORAL_CAPSULE | Freq: Once | ORAL | Status: AC
  Administered 2018-10-14: 300 mg via ORAL
  Filled 2018-10-14: qty 2

## 2018-10-14 NOTE — ED Triage Notes (Signed)
Patient ambulatory to triage with steady gait, without difficulty or distress noted; pt reports left upper dental pain since Saturday

## 2018-10-14 NOTE — ED Provider Notes (Signed)
[facility] Emergency Department Provider Note  ____________________________________________  Time seen: Approximately 10:53 PM  I have reviewed the triage vital signs and the nursing notes.   HISTORY  Chief Complaint Dental Pain    HPI Brian Conner is a 48 y.o. male who presents emergency department complaining of left upper dental pain.  Patient reports that he had 2 teeth extracted to the left upper dental region approximately a week ago.  Patient denies any complications with sockets.  He reports that he has inflammation/erythema/edema surrounding the first 2 molars of the left upper dentition.  Patient reports that this is the area of pain.  Patient has been taking amoxicillin with no relief of symptoms.  Patient denies any other complaints of fevers or chills, difficulty breathing or swallowing.  Patient denies any sore throat.     History reviewed. No pertinent past medical history.  There are no active problems to display for this patient.   Past Surgical History:  Procedure Laterality Date  . CHOLECYSTECTOMY      Prior to Admission medications   Medication Sig Start Date End Date Taking? Authorizing Provider  amoxicillin (AMOXIL) 500 MG capsule Take 1 capsule (500 mg total) by mouth 3 (three) times daily. 09/28/18   Joni Reining, PA-C  chlorhexidine (PERIDEX) 0.12 % solution Use as directed 10 mLs in the mouth or throat 2 (two) times daily. Swish and spit 10/14/18   Yianna Tersigni, Delorise Royals, PA-C  clindamycin (CLEOCIN) 300 MG capsule Take 1 capsule (300 mg total) by mouth 4 (four) times daily. 10/14/18   Ezma Rehm, Delorise Royals, PA-C  dibucaine (NUPERCAINAL) 1 % OINT Place 1 application rectally as needed for hemorrhoids. 01/30/18   Willy Eddy, MD  HYDROcodone-acetaminophen (NORCO/VICODIN) 5-325 MG tablet Take 1 tablet by mouth every 4 (four) hours as needed for moderate pain. 10/14/18   Tiondra Fang, Delorise Royals, PA-C  lidocaine (XYLOCAINE) 2 % solution  Use as directed 10 mLs in the mouth or throat every 4 (four) hours as needed for mouth pain. Swish, gargle, and spit 10/14/18   Pat Elicker, Delorise Royals, PA-C  oxyCODONE (ROXICODONE) 5 MG immediate release tablet Take 1-2 tablets (5-10 mg total) by mouth every 6 (six) hours as needed. 09/08/18 09/08/19  Evon Slack, PA-C  oxyCODONE-acetaminophen (PERCOCET) 7.5-325 MG tablet Take 1 tablet by mouth every 6 (six) hours as needed for severe pain. 09/28/18   Joni Reining, PA-C  polyethylene glycol [facility] / Ethelene Hal) packet Take 17 g by mouth daily. Mix one tablespoon with 8oz of your favorite juice or water every day until you are having soft formed stools. Then start taking once daily if you didn't have a stool the day before. 01/30/18   Willy Eddy, MD    Allergies Ibuprofen and Tramadol  No family history on file.  Social History Social History   Tobacco Use  . Smoking status: Current Every Day Smoker    Packs/day: 0.50    Types: Cigarettes  . Smokeless tobacco: Never Used  Substance Use Topics  . Alcohol use: Yes    Comment: seldom  . Drug use: Never     Review of Systems  Constitutional: No fever/chills Eyes: No visual changes. No discharge ENT: Positive for left upper dental pain Cardiovascular: no chest pain. Respiratory: no cough. No SOB. Gastrointestinal: No abdominal pain.  No nausea, no vomiting.  Musculoskeletal: Negative for musculoskeletal pain. Skin: Negative for rash, abrasions, lacerations, ecchymosis. Neurological: Negative for headaches, focal weakness or numbness. 10-point ROS otherwise  negative.  ____________________________________________   PHYSICAL EXAM:  VITAL SIGNS: ED Triage Vitals  Enc Vitals Group     BP 10/14/18 2137 (!) 157/88     Pulse Rate 10/14/18 2137 73     Resp 10/14/18 2137 20     Temp 10/14/18 2137 98 F (36.7 C)     Temp Source 10/14/18 2137 Oral     SpO2 10/14/18 2137 95 %     Weight 10/14/18 2139 300 lb (136.1 kg)      Height 10/14/18 2139 5\' 11"  (1.803 m)     Head Circumference --      Peak Flow --      Pain Score 10/14/18 2143 8     Pain Loc --      Pain Edu? --      Excl. in GC? --      Constitutional: Alert and oriented. Well appearing and in no acute distress. Eyes: Conjunctivae are normal. PERRL. EOMI. Head: Atraumatic. ENT:      Ears:       Nose: No congestion/rhinnorhea.      Mouth/Throat: Mucous membranes are moist.  Visualization of the dentition reveals overall poor dentition.  Patient does have 2 surgically absent teeth with healing sockets to the left upper dentition.  No signs of dry socket.  No signs of infection.  Patient does have 2 teeth with surrounding erythema and edema.  This is the first and second molar left upper dentition.  No draining purulence.  No posterior oropharyngeal edema or erythema. Neck: No stridor.  Neck is supple full range of motion Hematological/Lymphatic/Immunilogical: No cervical lymphadenopathy. Cardiovascular: Normal rate, regular rhythm. Normal S1 and S2.  Good peripheral circulation. Respiratory: Normal respiratory effort without tachypnea or retractions. Lungs CTAB. Good air entry to the bases with no decreased or absent breath sounds. Musculoskeletal: Full range of motion to all extremities. No gross deformities appreciated. Neurologic:  Normal speech and language. No gross focal neurologic deficits are appreciated.  Skin:  Skin is warm, dry and intact. No rash noted. Psychiatric: Mood and affect are normal. Speech and behavior are normal. Patient exhibits appropriate insight and judgement.   ____________________________________________   LABS (all labs ordered are listed, but only abnormal results are displayed)  Labs Reviewed - No data to display ____________________________________________  EKG   ____________________________________________  RADIOLOGY   No results  found.  ____________________________________________    PROCEDURES  Procedure(s) performed:    Procedures    Medications  clindamycin (CLEOCIN) capsule 300 mg (has no administration in time range)  HYDROcodone-acetaminophen (NORCO/VICODIN) 5-325 MG per tablet 1 tablet (has no administration in time range)     ____________________________________________   INITIAL IMPRESSION / ASSESSMENT AND PLAN / ED COURSE  Pertinent labs & imaging results that were available during my care of the patient were reviewed by me and considered in my medical decision making (see chart for details).  Review of the [place] CSRS was performed in accordance of the [facility] prior to dispensing any controlled drugs.      Patient's diagnosis is consistent with left upper dental infection.  Patient presents emergency department complaining of left upper dental pain.  Patient did have 2 teeth extracted recently.  There is no indication of infection to the sockets or indication of dry socket.  Patient does have findings consistent with dental infection to the first and second molar of the left upper dentition.  Patient will be treated with clindamycin as he is already on amoxicillin without improvement.  Patient is prescribed chlorhexidine mouthwash for cleaning, Magic mouthwash for additional symptom control and limited prescription of pain medication.  Follow-up with dentist..  Patient is given ED precautions to return to the ED for any worsening or new symptoms.     ____________________________________________  FINAL CLINICAL IMPRESSION(S) / ED DIAGNOSES  Final diagnoses:  Dental infection      NEW MEDICATIONS STARTED DURING THIS VISIT:  ED Discharge Orders         Ordered    clindamycin (CLEOCIN) 300 MG capsule  4 times daily     10/14/18 2309    chlorhexidine (PERIDEX) 0.12 % solution  2 times daily     10/14/18 2309    lidocaine (XYLOCAINE) 2 % solution  Every 4 hours PRN     10/14/18 2309     HYDROcodone-acetaminophen (NORCO/VICODIN) 5-325 MG tablet  Every 4 hours PRN     10/14/18 2309              This chart was dictated using voice recognition software/Dragon. Despite best efforts to proofread, errors can occur which can change the meaning. Any change was purely unintentional.    Racheal Patches, PA-C 10/14/18 2310    Rockne Menghini, MD 10/14/18 2312

## 2019-07-24 ENCOUNTER — Other Ambulatory Visit: Payer: Self-pay

## 2019-07-24 DIAGNOSIS — Z20822 Contact with and (suspected) exposure to covid-19: Secondary | ICD-10-CM

## 2019-07-25 LAB — NOVEL CORONAVIRUS, NAA: SARS-CoV-2, NAA: NOT DETECTED

## 2019-07-26 ENCOUNTER — Telehealth: Payer: Self-pay

## 2019-07-26 NOTE — Telephone Encounter (Signed)
Negative COVID results given. Patient results "NOT Detected." Caller expressed understanding. ° °

## 2020-11-11 ENCOUNTER — Other Ambulatory Visit: Payer: Medicaid Other | Attending: Internal Medicine

## 2020-11-12 ENCOUNTER — Other Ambulatory Visit
Admission: RE | Admit: 2020-11-12 | Discharge: 2020-11-12 | Disposition: A | Discharge status: Home / Self Care | Payer: Medicaid Other | Source: Ambulatory Visit | Attending: Internal Medicine | Admitting: Internal Medicine

## 2020-11-12 ENCOUNTER — Other Ambulatory Visit: Payer: Self-pay

## 2020-11-12 ENCOUNTER — Encounter: Payer: Self-pay | Admitting: Internal Medicine

## 2020-11-12 DIAGNOSIS — Z20822 Contact with and (suspected) exposure to covid-19: Secondary | ICD-10-CM | POA: Diagnosis not present

## 2020-11-12 DIAGNOSIS — Z01812 Encounter for preprocedural laboratory examination: Secondary | ICD-10-CM | POA: Insufficient documentation

## 2020-11-13 LAB — SARS CORONAVIRUS 2 (TAT 6-24 HRS): SARS Coronavirus 2: NEGATIVE

## 2020-11-15 ENCOUNTER — Ambulatory Visit: Payer: Medicaid Other | Admitting: Anesthesiology

## 2020-11-15 ENCOUNTER — Other Ambulatory Visit: Payer: Medicaid Other

## 2020-11-15 ENCOUNTER — Ambulatory Visit
Admission: RE | Admit: 2020-11-15 | Discharge: 2020-11-15 | Disposition: A | Discharge status: Home / Self Care | Payer: Medicaid Other | Attending: Internal Medicine | Admitting: Internal Medicine

## 2020-11-15 ENCOUNTER — Other Ambulatory Visit: Payer: Self-pay

## 2020-11-15 ENCOUNTER — Encounter: Payer: Self-pay | Admitting: Internal Medicine

## 2020-11-15 ENCOUNTER — Encounter
Admission: RE | Disposition: A | Discharge status: Home / Self Care | Payer: Self-pay | Source: Home / Self Care | Attending: Internal Medicine

## 2020-11-15 DIAGNOSIS — K643 Fourth degree hemorrhoids: Secondary | ICD-10-CM | POA: Diagnosis not present

## 2020-11-15 DIAGNOSIS — Z7982 Long term (current) use of aspirin: Secondary | ICD-10-CM | POA: Insufficient documentation

## 2020-11-15 DIAGNOSIS — Z7984 Long term (current) use of oral hypoglycemic drugs: Secondary | ICD-10-CM | POA: Insufficient documentation

## 2020-11-15 DIAGNOSIS — Z1211 Encounter for screening for malignant neoplasm of colon: Secondary | ICD-10-CM | POA: Diagnosis not present

## 2020-11-15 DIAGNOSIS — Z885 Allergy status to narcotic agent status: Secondary | ICD-10-CM | POA: Diagnosis not present

## 2020-11-15 DIAGNOSIS — Z800 Family history of malignant neoplasm of digestive organs: Secondary | ICD-10-CM | POA: Insufficient documentation

## 2020-11-15 DIAGNOSIS — Z79899 Other long term (current) drug therapy: Secondary | ICD-10-CM | POA: Insufficient documentation

## 2020-11-15 DIAGNOSIS — K5730 Diverticulosis of large intestine without perforation or abscess without bleeding: Secondary | ICD-10-CM | POA: Insufficient documentation

## 2020-11-15 DIAGNOSIS — Z888 Allergy status to other drugs, medicaments and biological substances status: Secondary | ICD-10-CM | POA: Insufficient documentation

## 2020-11-15 HISTORY — DX: Essential (primary) hypertension: I10

## 2020-11-15 HISTORY — PX: COLONOSCOPY WITH PROPOFOL: SHX5780

## 2020-11-15 HISTORY — DX: Type 2 diabetes mellitus without complications: E11.9

## 2020-11-15 HISTORY — DX: Hyperlipidemia, unspecified: E78.5

## 2020-11-15 LAB — GLUCOSE, CAPILLARY: Glucose-Capillary: 110 mg/dL — ABNORMAL HIGH (ref 70–99)

## 2020-11-15 SURGERY — COLONOSCOPY WITH PROPOFOL
Anesthesia: General

## 2020-11-15 MED ORDER — EPHEDRINE 5 MG/ML INJ
INTRAVENOUS | Status: AC
  Filled 2020-11-15: qty 10

## 2020-11-15 MED ORDER — PROPOFOL 10 MG/ML IV BOLUS
INTRAVENOUS | Status: DC | PRN
  Administered 2020-11-15: 100 mg via INTRAVENOUS

## 2020-11-15 MED ORDER — GLYCOPYRROLATE 0.2 MG/ML IJ SOLN
INTRAMUSCULAR | Status: DC | PRN
  Administered 2020-11-15: .2 mg via INTRAVENOUS

## 2020-11-15 MED ORDER — PROPOFOL 500 MG/50ML IV EMUL
INTRAVENOUS | Status: AC
  Filled 2020-11-15: qty 50

## 2020-11-15 MED ORDER — MIDAZOLAM HCL 5 MG/5ML IJ SOLN
INTRAMUSCULAR | Status: DC | PRN
  Administered 2020-11-15: 2 mg via INTRAVENOUS

## 2020-11-15 MED ORDER — LIDOCAINE 2% (20 MG/ML) 5 ML SYRINGE
INTRAMUSCULAR | Status: DC | PRN
  Administered 2020-11-15: 50 mg via INTRAVENOUS

## 2020-11-15 MED ORDER — SODIUM CHLORIDE 0.9 % IV SOLN: INTRAVENOUS | Status: DC

## 2020-11-15 MED ORDER — MIDAZOLAM HCL 2 MG/2ML IJ SOLN
INTRAMUSCULAR | Status: AC
  Filled 2020-11-15: qty 2

## 2020-11-15 MED ORDER — GLYCOPYRROLATE 0.2 MG/ML IJ SOLN
INTRAMUSCULAR | Status: AC
  Filled 2020-11-15: qty 1

## 2020-11-15 MED ORDER — PROPOFOL 500 MG/50ML IV EMUL
INTRAVENOUS | Status: DC | PRN
  Administered 2020-11-15: 120 ug/kg/min via INTRAVENOUS

## 2020-11-15 NOTE — Anesthesia Preprocedure Evaluation (Signed)
Anesthesia Evaluation  Patient identified by MRN, date of birth, ID band Patient awake    Reviewed: Allergy & Precautions, H&P , NPO status , Patient's Chart, lab work & pertinent test results  History of Anesthesia Complications Negative for: history of anesthetic complications  Airway Mallampati: III  TM Distance: <3 FB Neck ROM: full    Dental  (+) Chipped, Poor Dentition   Pulmonary neg shortness of breath, Current Smoker and Patient abstained from smoking.,    Pulmonary exam normal        Cardiovascular Exercise Tolerance: Good hypertension, (-) angina(-) Past MI and (-) DOE Normal cardiovascular exam     Neuro/Psych negative neurological ROS  negative psych ROS   GI/Hepatic negative GI ROS, Neg liver ROS, neg GERD  ,  Endo/Other  diabetes, Type 2  Renal/GU negative Renal ROS  negative genitourinary   Musculoskeletal   Abdominal   Peds  Hematology negative hematology ROS (+)   Anesthesia Other Findings Past Medical History: No date: Diabetes mellitus without complication (HCC) No date: Hyperlipemia No date: Hypertension  Past Surgical History: No date: CHOLECYSTECTOMY  BMI    Body Mass Index: 39.61 kg/m      Reproductive/Obstetrics negative OB ROS                             Anesthesia Physical Anesthesia Plan  ASA: III  Anesthesia Plan: General   Post-op Pain Management:    Induction: Intravenous  PONV Risk Score and Plan: Propofol infusion and TIVA  Airway Management Planned: Natural Airway and Nasal Cannula  Additional Equipment:   Intra-op Plan:   Post-operative Plan:   Informed Consent: I have reviewed the patients History and Physical, chart, labs and discussed the procedure including the risks, benefits and alternatives for the proposed anesthesia with the patient or authorized representative who has indicated his/her understanding and acceptance.      Dental Advisory Given  Plan Discussed with: Anesthesiologist, CRNA and Surgeon  Anesthesia Plan Comments: (Patient consented for risks of anesthesia including but not limited to:  - adverse reactions to medications - risk of airway placement if required - damage to eyes, teeth, lips or other oral mucosa - nerve damage due to positioning  - sore throat or hoarseness - Damage to heart, brain, nerves, lungs, other parts of body or loss of life  Patient voiced understanding.)        Anesthesia Quick Evaluation

## 2020-11-15 NOTE — Transfer of Care (Signed)
Immediate Anesthesia Transfer of Care Note  Patient: Brian Conner  Procedure(s) Performed: COLONOSCOPY WITH PROPOFOL (N/A )  Patient Location: Endoscopy Unit  Anesthesia Type:General  Level of Consciousness: awake and alert   Airway & Oxygen Therapy: Patient Spontanous Breathing and Patient connected to face mask oxygen  Post-op Assessment: Report given to RN and Post -op Vital signs reviewed and stable  Post vital signs: Reviewed  Last Vitals:  Vitals Value Taken Time  BP 112/72 11/15/20 1542  Temp 36.1 C 11/15/20 1532  Pulse 62 11/15/20 1542  Resp 23 11/15/20 1542  SpO2 97 % 11/15/20 1542  Vitals shown include unvalidated device data.  Last Pain:  Vitals:   11/15/20 1532  TempSrc: Temporal  PainSc: 0-No pain      Patients Stated Pain Goal: 3 (11/15/20 1353)  Complications: No complications documented.

## 2020-11-15 NOTE — H&P (Signed)
Outpatient short stay form Pre-procedure 11/15/2020 2:00 PM Brian Conner, M.D.  Primary Physician: Franco Nones, FNP  Reason for visit:  Family history of colon cancer - Father AND Sister.  History of present illness: 50 year old patient presenting for family history of colon cancer. Patient denies any change in bowel habits, rectal bleeding or involuntary weight loss. He c/o of occasional hemorrhoidal discomfort.    No current facility-administered medications for this encounter.  Medications Prior to Admission  Medication Sig Dispense Refill Last Dose  . aspirin EC 81 MG tablet Take 81 mg by mouth daily. Swallow whole.   11/15/2020 at Unknown time  . atenolol-chlorthalidone (TENORETIC) 50-25 MG tablet Take 1 tablet by mouth daily.   11/15/2020 at Unknown time  . cholecalciferol (VITAMIN D3) 25 MCG (1000 UNIT) tablet Take 1,250 Units by mouth daily.   Past Week at Unknown time  . clonazePAM (KLONOPIN) 0.5 MG tablet Take 0.5 mg by mouth 2 (two) times daily as needed for anxiety.     . empagliflozin (JARDIANCE) 25 MG TABS tablet Take by mouth daily.   11/14/2020 at Unknown time  . lisinopril (ZESTRIL) 40 MG tablet Take 40 mg by mouth daily.   11/15/2020 at Unknown time  . metFORMIN (GLUCOPHAGE) 500 MG tablet Take by mouth 2 (two) times daily with a meal.   11/14/2020 at Unknown time  . pantoprazole (PROTONIX) 40 MG tablet Take 40 mg by mouth daily.   11/14/2020 at Unknown time  . rosuvastatin (CRESTOR) 40 MG tablet Take 40 mg by mouth daily.   11/15/2020 at Unknown time  . sildenafil (REVATIO) 20 MG tablet Take 20 mg by mouth 3 (three) times daily.     Marland Kitchen amoxicillin (AMOXIL) 500 MG capsule Take 1 capsule (500 mg total) by mouth 3 (three) times daily. 30 capsule 0   . chlorhexidine (PERIDEX) 0.12 % solution Use as directed 10 mLs in the mouth or throat 2 (two) times daily. Swish and spit 120 mL 0   . clindamycin (CLEOCIN) 300 MG capsule Take 1 capsule (300 mg total) by mouth 4 (four) times daily. 28  capsule 0   . dibucaine (NUPERCAINAL) 1 % OINT Place 1 application rectally as needed for hemorrhoids. 28 g 1   . HYDROcodone-acetaminophen (NORCO/VICODIN) 5-325 MG tablet Take 1 tablet by mouth every 4 (four) hours as needed for moderate pain. 12 tablet 0   . lidocaine (XYLOCAINE) 2 % solution Use as directed 10 mLs in the mouth or throat every 4 (four) hours as needed for mouth pain. Swish, gargle, and spit 200 mL 0   . oxyCODONE-acetaminophen (PERCOCET) 7.5-325 MG tablet Take 1 tablet by mouth every 6 (six) hours as needed for severe pain. 12 tablet 0   . polyethylene glycol (MIRALAX / GLYCOLAX) packet Take 17 g by mouth daily. Mix one tablespoon with 8oz of your favorite juice or water every day until you are having soft formed stools. Then start taking once daily if you didn't have a stool the day before. 30 each 0      Allergies  Allergen Reactions  . Ibuprofen Nausea And Vomiting  . Tramadol Nausea And Vomiting     Past Medical History:  Diagnosis Date  . Diabetes mellitus without complication (HCC)   . Hyperlipemia   . Hypertension     Review of systems:  Otherwise negative.    Physical Exam  Gen: Alert, oriented. Appears stated age.  HEENT: [place]/AT. PERRLA. Lungs: CTA, no wheezes. CV: RR nl  S1, S2. Abd: soft, benign, no masses. BS+ Ext: No edema. Pulses 2+    Planned procedures: Proceed with colonoscopy. The patient understands the nature of the planned procedure, indications, risks, alternatives and potential complications including but not limited to bleeding, infection, perforation, damage to internal organs and possible oversedation/side effects from anesthesia. The patient agrees and gives consent to proceed.  Please refer to procedure notes for findings, recommendations and patient disposition/instructions.     Dreonna Hussein K. Norma Conner, M.D. Gastroenterology 11/15/2020  2:00 PM

## 2020-11-15 NOTE — Op Note (Signed)
[facility] Gastroenterology Patient Name: Brian Conner Procedure Date: 11/15/2020 3:13 PM MRN: [id number] Account #: [id number] Date of Birth: [birth year] Admit Type: Outpatient Age: 50 Room: [facility] ENDO ROOM 2 Gender: Male Note Status: Finalized Procedure:             Colonoscopy Indications:           Screening in patient at increased risk: Colorectal                         cancer in father 53 or older, Screening in patient at                         increased risk: Colorectal cancer in sister before age                         51 Providers:             Gor Vestal K. Jaylei Fuerte MD, MD Medicines:             Propofol per Anesthesia Complications:         No immediate complications. Procedure:             Pre-Anesthesia Assessment:                        - The risks and benefits of the procedure and the                         sedation options and risks were discussed with the                         patient. All questions were answered and informed                         consent was obtained.                        - Patient identification and proposed procedure were                         verified prior to the procedure by the nurse. The                         procedure was verified in the procedure room.                        - ASA Grade Assessment: III - A patient with severe                         systemic disease.                        - After reviewing the risks and benefits, the patient                         was deemed in satisfactory condition to undergo the                         procedure.  After obtaining informed consent, the colonoscope was                         passed under direct vision. Throughout the procedure,                         the patient's blood pressure, pulse, and oxygen                         saturations were monitored continuously. The                         Colonoscope was introduced through the anus and                          advanced to the the cecum, identified by appendiceal                         orifice and ileocecal valve. The colonoscopy was                         performed without difficulty. The patient tolerated                         the procedure well. The quality of the bowel                         preparation was good. The ileocecal valve, appendiceal                         orifice, and rectum were photographed. Findings:      The perianal exam findings include internal hemorrhoids that do not       return to the anal canal, thus continuously prolapsed (Grade IV).      The digital rectal exam was otherwise normal. Pertinent negatives       include normal sphincter tone.      Many medium-mouthed diverticula were found in the sigmoid colon.      Non-bleeding internal hemorrhoids were found during retroflexion. The       hemorrhoids were Grade III (internal hemorrhoids that prolapse but       require manual reduction).      The exam was otherwise without abnormality. Impression:            - Internal hemorrhoids that do not return to the anal                         canal, thus continuously prolapsed (Grade IV) found on                         perianal exam.                        - Diverticulosis in the sigmoid colon.                        - Non-bleeding internal hemorrhoids.                        - The examination was otherwise normal.                        -  No specimens collected. Recommendation:        - Patient has a contact number available for                         emergencies. The signs and symptoms of potential                         delayed complications were discussed with the patient.                         Return to normal activities tomorrow. Written                         discharge instructions were provided to the patient.                        - Resume previous diet.                        - Continue present medications.                         - Repeat colonoscopy in 5 years for screening purposes.                        - Return to GI office PRN.                        - Consider referral to colo-rectal surgeon by primary                         provider. Hemorrhoidectomy.                        - The findings and recommendations were discussed with                         the patient. Procedure Code(s):     --- Professional ---                        D3220, Colorectal cancer screening; colonoscopy on                         individual at high risk Diagnosis Code(s):     --- Professional ---                        K57.30, Diverticulosis of large intestine without                         perforation or abscess without bleeding                        Z80.0, Family history of malignant neoplasm of                         digestive organs                        K64.3, Fourth degree hemorrhoids CPT copyright 2019 American Medical Association. All rights reserved. The codes documented  in this report are preliminary and upon coder review may  be revised to meet current compliance requirements. Stanton Kidney MD, MD 11/15/2020 3:35:40 PM This report has been signed electronically. Number of Addenda: 0 Note Initiated On: 11/15/2020 3:13 PM Scope Withdrawal Time: 0 hours 5 minutes 5 seconds  Total Procedure Duration: 0 hours 7 minutes 26 seconds  Estimated Blood Loss:  Estimated blood loss: none.      [facility]

## 2020-11-15 NOTE — Interval H&P Note (Signed)
History and Physical Interval Note:  11/15/2020 2:01 PM  Brian Conner  has presented today for surgery, with the diagnosis of family hx of colon cancer.  The various methods of treatment have been discussed with the patient and family. After consideration of risks, benefits and other options for treatment, the patient has consented to  Procedure(s): COLONOSCOPY WITH PROPOFOL (N/A) as a surgical intervention.  The patient's history has been reviewed, patient examined, no change in status, stable for surgery.  I have reviewed the patient's chart and labs.  Questions were answered to the patient's satisfaction.     [place], [place]

## 2020-11-16 ENCOUNTER — Encounter: Payer: Self-pay | Admitting: Internal Medicine

## 2020-11-16 NOTE — Anesthesia Postprocedure Evaluation (Signed)
Anesthesia Post Note  Patient: Brian Conner  Procedure(s) Performed: COLONOSCOPY WITH PROPOFOL (N/A )  Patient location during evaluation: Endoscopy Anesthesia Type: General Level of consciousness: awake and alert Pain management: pain level controlled Vital Signs Assessment: post-procedure vital signs reviewed and stable Respiratory status: spontaneous breathing, nonlabored ventilation, respiratory function stable and patient connected to nasal cannula oxygen Cardiovascular status: blood pressure returned to baseline and stable Postop Assessment: no apparent nausea or vomiting Anesthetic complications: no   No complications documented.   Last Vitals:  Vitals:   11/15/20 1532 11/15/20 1602  BP: (!) 101/51 121/85  Pulse:    Resp:    Temp: (!) 36.1 C   SpO2:      Last Pain:  Vitals:   11/15/20 1602  TempSrc:   PainSc: 0-No pain                 Cleda Mccreedy Marlean Mortell

## 2021-10-10 ENCOUNTER — Emergency Department: Payer: Medicaid Other

## 2021-10-10 ENCOUNTER — Other Ambulatory Visit: Payer: Self-pay

## 2021-10-10 ENCOUNTER — Emergency Department
Admission: EM | Admit: 2021-10-10 | Discharge: 2021-10-10 | Disposition: A | Discharge status: Home / Self Care | Payer: Medicaid Other | Attending: Emergency Medicine | Admitting: Emergency Medicine

## 2021-10-10 DIAGNOSIS — W228XXA Striking against or struck by other objects, initial encounter: Secondary | ICD-10-CM | POA: Insufficient documentation

## 2021-10-10 DIAGNOSIS — M79642 Pain in left hand: Secondary | ICD-10-CM | POA: Insufficient documentation

## 2021-10-10 DIAGNOSIS — S6992XA Unspecified injury of left wrist, hand and finger(s), initial encounter: Secondary | ICD-10-CM | POA: Diagnosis present

## 2021-10-10 DIAGNOSIS — I10 Essential (primary) hypertension: Secondary | ICD-10-CM | POA: Insufficient documentation

## 2021-10-10 DIAGNOSIS — E119 Type 2 diabetes mellitus without complications: Secondary | ICD-10-CM | POA: Insufficient documentation

## 2021-10-10 DIAGNOSIS — S62305A Unspecified fracture of fourth metacarpal bone, left hand, initial encounter for closed fracture: Secondary | ICD-10-CM

## 2021-10-10 DIAGNOSIS — S62315A Displaced fracture of base of fourth metacarpal bone, left hand, initial encounter for closed fracture: Secondary | ICD-10-CM | POA: Insufficient documentation

## 2021-10-10 MED ORDER — NAPROXEN 500 MG PO TABS
500.0000 mg | ORAL_TABLET | Freq: Once | ORAL | Status: AC
  Administered 2021-10-10: 500 mg via ORAL
  Filled 2021-10-10: qty 1

## 2021-10-10 MED ORDER — ONDANSETRON 4 MG PO TBDP
8.0000 mg | ORAL_TABLET | Freq: Once | ORAL | Status: AC
  Administered 2021-10-10: 8 mg via ORAL
  Filled 2021-10-10: qty 2

## 2021-10-10 MED ORDER — OXYCODONE-ACETAMINOPHEN 5-325 MG PO TABS: 1.0000 | ORAL_TABLET | Freq: Once | ORAL | Status: DC

## 2021-10-10 MED ORDER — ONDANSETRON 4 MG PO TBDP: 8.0000 mg | ORAL_TABLET | Freq: Once | ORAL | Status: DC

## 2021-10-10 MED ORDER — OXYCODONE-ACETAMINOPHEN 5-325 MG PO TABS
1.0000 | ORAL_TABLET | Freq: Once | ORAL | Status: AC
  Administered 2021-10-10: 1 via ORAL
  Filled 2021-10-10: qty 1

## 2021-10-10 NOTE — ED Notes (Signed)
Ice pack applied.

## 2021-10-10 NOTE — ED Provider Notes (Signed)
[facility] Provider Note    Event Date/Time   First MD Initiated Contact with Patient 10/10/21 [phone]     (approximate)   History   Hand Pain   HPI  Brian Conner is a 51 y.o. male with a past history of hypertension diabetes and obesity who comes ED complaining of left hand pain that started tonight.  Patient states he was having a vivid "weird" dream, and he hit his hand on the wall while asleep causing sudden severe pain which woke him up.  Pain is nonradiating, no other injuries.  No fevers, no weakness or numbness or paleness in the fingers.  No other complaints     Physical Exam   Triage Vital Signs: ED Triage Vitals  Enc Vitals Group     BP 10/10/21 0425 (!) 118/95     Pulse Rate 10/10/21 0414 (!) 52     Resp 10/10/21 0414 20     Temp 10/10/21 0414 97.6 F (36.4 C)     Temp Source 10/10/21 0414 Oral     SpO2 10/10/21 0414 94 %     Weight 10/10/21 0412 290 lb (131.5 kg)     Height 10/10/21 0412 6\' 1"  (1.854 m)     Head Circumference --      Peak Flow --      Pain Score 10/10/21 0412 10     Pain Loc --      Pain Edu? --      Excl. in GC? --     Most recent vital signs: Vitals:   10/10/21 0414 10/10/21 0425  BP:  (!) 118/95  Pulse: (!) 52 (!) 54  Resp: 20   Temp: 97.6 F (36.4 C)   SpO2: 94% 93%     General: Awake, no distress.  CV:  Good peripheral perfusion.  Resp:  Normal effort.  Abd:  No distention.  Other:  Diffuse soft swelling over the dorsum of the left hand.  Tenderness at the proximal fourth metacarpal.  No rotation of the fingers, no wounds or obvious deformity.  Tissues are soft   ED Results / Procedures / Treatments   Labs (all labs ordered are listed, but only abnormal results are displayed) Labs Reviewed - No data to display   EKG     RADIOLOGY X-ray left hand viewed and interpreted by me, shows a fracture of the proximal fourth metacarpal.  Radiology report reviewed    PROCEDURES:  Critical  Care performed: No  .Ortho Injury Treatment  Date/Time: 10/10/2021 5:48 AM Performed by: 10/12/2021, MD Authorized by: Sharman Cheek, MD   Consent:    Consent obtained:  Verbal   Consent given by:  Patient   Risks discussed:  Fracture and stiffnessInjury location: hand Location details: left hand Injury type: fracture Fracture type: fourth metacarpal Pre-procedure neurovascular assessment: neurovascularly intact Pre-procedure distal perfusion: normal Pre-procedure neurological function: normal Pre-procedure range of motion: normal  Anesthesia: Local anesthesia used: no  Patient sedated: NoManipulation performed: no Immobilization: splint Splint type: ulnar gutter Splint Applied by: ED Nurse and ED Provider Supplies used: cotton padding, elastic bandage and Ortho-Glass Post-procedure neurovascular assessment: post-procedure neurovascularly intact Post-procedure distal perfusion: normal Post-procedure neurological function: normal Post-procedure range of motion: normal     MEDICATIONS ORDERED IN ED: Medications - No data to display   IMPRESSION / MDM / ASSESSMENT AND PLAN / ED COURSE  I reviewed the triage vital signs and the nursing notes.  Differential diagnosis includes, but is not limited to, metacarpal fracture , contusion  Patient presents with hand pain after hitting a wall.  X-ray shows fracture at the base of the fourth metacarpal.  Ulnar gutter splint applied, will refer to orthopedics.       FINAL CLINICAL IMPRESSION(S) / ED DIAGNOSES   Final diagnoses:  Left hand pain     Rx / DC Orders   ED Discharge Orders     None        Note:  This document was prepared using Dragon voice recognition software and may include unintentional dictation errors.   Sharman Cheek, MD 10/10/21 [phone]

## 2021-10-10 NOTE — Discharge Instructions (Addendum)
Take tylenol 1000mg  every 8 hours for pain.  Keep the left arm elevated above your body, and apply ice on and off for the next 48 hours.

## 2021-10-10 NOTE — ED Triage Notes (Signed)
Patient reports left hand pain after hitting the wall in his sleep.

## 2021-12-13 ENCOUNTER — Observation Stay
Admission: EM | Admit: 2021-12-13 | Discharge: 2021-12-15 | Disposition: A | Discharge status: Home / Self Care | Payer: Medicaid Other | Attending: Internal Medicine | Admitting: Internal Medicine

## 2021-12-13 ENCOUNTER — Emergency Department: Payer: Medicaid Other

## 2021-12-13 ENCOUNTER — Other Ambulatory Visit: Payer: Self-pay

## 2021-12-13 DIAGNOSIS — N179 Acute kidney failure, unspecified: Principal | ICD-10-CM

## 2021-12-13 DIAGNOSIS — Z7982 Long term (current) use of aspirin: Secondary | ICD-10-CM | POA: Diagnosis not present

## 2021-12-13 DIAGNOSIS — Z79899 Other long term (current) drug therapy: Secondary | ICD-10-CM | POA: Diagnosis not present

## 2021-12-13 DIAGNOSIS — F17210 Nicotine dependence, cigarettes, uncomplicated: Secondary | ICD-10-CM | POA: Diagnosis not present

## 2021-12-13 DIAGNOSIS — R799 Abnormal finding of blood chemistry, unspecified: Secondary | ICD-10-CM | POA: Diagnosis present

## 2021-12-13 DIAGNOSIS — Z7984 Long term (current) use of oral hypoglycemic drugs: Secondary | ICD-10-CM | POA: Diagnosis not present

## 2021-12-13 DIAGNOSIS — I10 Essential (primary) hypertension: Secondary | ICD-10-CM | POA: Insufficient documentation

## 2021-12-13 DIAGNOSIS — E119 Type 2 diabetes mellitus without complications: Secondary | ICD-10-CM | POA: Insufficient documentation

## 2021-12-13 LAB — CBC WITH DIFFERENTIAL/PLATELET
Abs Immature Granulocytes: 0.05 10*3/uL (ref 0.00–0.07)
Basophils Absolute: 0 10*3/uL (ref 0.0–0.1)
Basophils Relative: 0 %
Eosinophils Absolute: 0.1 10*3/uL (ref 0.0–0.5)
Eosinophils Relative: 1 %
HCT: 34.9 % — ABNORMAL LOW (ref 39.0–52.0)
Hemoglobin: 11.1 g/dL — ABNORMAL LOW (ref 13.0–17.0)
Immature Granulocytes: 1 %
Lymphocytes Relative: 34 %
Lymphs Abs: 2.8 10*3/uL (ref 0.7–4.0)
MCH: 29.9 pg (ref 26.0–34.0)
MCHC: 31.8 g/dL (ref 30.0–36.0)
MCV: 94.1 fL (ref 80.0–100.0)
Monocytes Absolute: 0.5 10*3/uL (ref 0.1–1.0)
Monocytes Relative: 6 %
Neutro Abs: 4.8 10*3/uL (ref 1.7–7.7)
Neutrophils Relative %: 58 %
Platelets: 156 10*3/uL (ref 150–400)
RBC: 3.71 MIL/uL — ABNORMAL LOW (ref 4.22–5.81)
RDW: 13.3 % (ref 11.5–15.5)
WBC: 8.3 10*3/uL (ref 4.0–10.5)
nRBC: 0 % (ref 0.0–0.2)

## 2021-12-13 LAB — COMPREHENSIVE METABOLIC PANEL
ALT: 21 U/L (ref 0–44)
AST: 26 U/L (ref 15–41)
Albumin: 3.6 g/dL (ref 3.5–5.0)
Alkaline Phosphatase: 46 U/L (ref 38–126)
Anion gap: 12 (ref 5–15)
BUN: 54 mg/dL — ABNORMAL HIGH (ref 6–20)
CO2: 25 mmol/L (ref 22–32)
Calcium: 9.3 mg/dL (ref 8.9–10.3)
Chloride: 105 mmol/L (ref 98–111)
Creatinine, Ser: 3.52 mg/dL — ABNORMAL HIGH (ref 0.61–1.24)
GFR, Estimated: 20 mL/min — ABNORMAL LOW (ref >60)
Glucose, Bld: 136 mg/dL — ABNORMAL HIGH (ref 70–99)
Potassium: 3.7 mmol/L (ref 3.5–5.1)
Sodium: 142 mmol/L (ref 135–145)
Total Bilirubin: 0.7 mg/dL (ref 0.3–1.2)
Total Protein: 7.3 g/dL (ref 6.5–8.1)

## 2021-12-13 LAB — URINALYSIS, ROUTINE W REFLEX MICROSCOPIC
Bilirubin Urine: NEGATIVE
Glucose, UA: >=500 mg/dL — AB
Ketones, ur: NEGATIVE mg/dL
Leukocytes,Ua: NEGATIVE
Nitrite: NEGATIVE
Protein, ur: NEGATIVE mg/dL
Specific Gravity, Urine: 1.01 (ref 1.005–1.030)
pH: 5 (ref 5.0–8.0)

## 2021-12-13 MED ORDER — OXYCODONE HCL 5 MG PO TABS
5.0000 mg | ORAL_TABLET | Freq: Once | ORAL | Status: AC
  Administered 2021-12-13: 5 mg via ORAL
  Filled 2021-12-13: qty 1

## 2021-12-13 MED ORDER — ACETAMINOPHEN 500 MG PO TABS
1000.0000 mg | ORAL_TABLET | Freq: Once | ORAL | Status: AC
  Administered 2021-12-13: 1000 mg via ORAL
  Filled 2021-12-13: qty 2

## 2021-12-13 MED ORDER — LACTATED RINGERS IV BOLUS
1000.0000 mL | Freq: Once | INTRAVENOUS | Status: AC
  Administered 2021-12-13: 1000 mL via INTRAVENOUS

## 2021-12-13 MED ORDER — LACTATED RINGERS IV SOLN: INTRAVENOUS | Status: DC

## 2021-12-13 MED ORDER — HYDRALAZINE HCL 20 MG/ML IJ SOLN
10.0000 mg | Freq: Four times a day (QID) | INTRAMUSCULAR | Status: DC
  Filled 2021-12-13: qty 1

## 2021-12-13 MED ORDER — SODIUM CHLORIDE 0.9 % IV SOLN: 20.0000 mL/h | INTRAVENOUS | Status: DC

## 2021-12-13 MED ORDER — HEPARIN SODIUM (PORCINE) 5000 UNIT/ML IJ SOLN
5000.0000 [IU] | Freq: Three times a day (TID) | INTRAMUSCULAR | Status: DC
  Administered 2021-12-13 – 2021-12-15 (×6): 5000 [IU] via SUBCUTANEOUS
  Filled 2021-12-13 (×6): qty 1

## 2021-12-13 MED ORDER — PANTOPRAZOLE SODIUM 40 MG IV SOLR
40.0000 mg | Freq: Two times a day (BID) | INTRAVENOUS | Status: DC
Start: 2021-12-13 — End: 2021-12-15
  Administered 2021-12-13 – 2021-12-15 (×4): 40 mg via INTRAVENOUS
  Filled 2021-12-13 (×4): qty 10

## 2021-12-13 MED ORDER — ACETAMINOPHEN 325 MG PO TABS
650.0000 mg | ORAL_TABLET | Freq: Four times a day (QID) | ORAL | Status: DC | PRN
  Administered 2021-12-14: 650 mg via ORAL
  Filled 2021-12-13 (×2): qty 2

## 2021-12-13 MED ORDER — ACETAMINOPHEN 650 MG RE SUPP: 650.0000 mg | Freq: Four times a day (QID) | RECTAL | Status: DC | PRN

## 2021-12-13 MED ORDER — INSULIN ASPART 100 UNIT/ML IJ SOLN
0.0000 [IU] | Freq: Three times a day (TID) | INTRAMUSCULAR | Status: DC
  Filled 2021-12-13 (×2): qty 1

## 2021-12-13 MED ORDER — SODIUM CHLORIDE 0.9% FLUSH
3.0000 mL | Freq: Two times a day (BID) | INTRAVENOUS | Status: DC
  Administered 2021-12-13 – 2021-12-15 (×4): 3 mL via INTRAVENOUS

## 2021-12-13 NOTE — Assessment & Plan Note (Signed)
Blood pressure 119/60, pulse 70, temperature 97.7 ?F (36.5 ?C), temperature source Oral, resp. rate 20, height 6\' 1"  (1.854 m), weight 129.3 kg, SpO2 94 %. ?Blood pressure is low currently we will hold patient's Tenoretic and lisinopril. ?

## 2021-12-13 NOTE — ED Triage Notes (Signed)
Pt sent from PCP for abnormal kidney functions lab. Per pt, PCP told him that he is in kidney failure and needed to come to the ER. Pt endorses generalized body pain, but its is worse in his lower right back pain.  ?

## 2021-12-13 NOTE — Assessment & Plan Note (Signed)
Suspect patient may have mild CKD if that underlying with a normal creatinine mildly decreased GFR at baseline however we have no way to document that. ?For his diabetes we will currently stop his metformin and Jardiance and continue patient on sliding scale insulin regimen and glycemic protocol. ?Discharge plan with Januvia insulin as per provider preference. ?

## 2021-12-13 NOTE — Assessment & Plan Note (Signed)
Attribute patient's acute kidney injury to his chlorthalidone, lisinopril, metformin, Jardiance. ?We will hold all diuretics and renally cleared blood pressure meds including lisinopril and metformin along with Jardiance currently. ?Patient will start hydralazine overnight for blood pressure control. ?We will renal ultrasound within normal limits. ?We will continue to hydrate overnight with LR. ?Nephrology consult as deemed appropriate per a.m. team. ?Suspect mostly prerenal etiology 90% and may be renal 10% as I do not have a kidney function or creatinine in the past 2 years in our records. ?

## 2021-12-13 NOTE — H&P (Signed)
?History and Physical  ? ? ?Patient: Brian Conner:[id number] DOB: 10-07-1970 ?DOA: 12/13/2021 ?DOS: the patient was seen and examined on 12/13/2021 ?PCP: Armando Gang, FNP  ?Patient coming from: Home ? ?Chief Complaint:  ?Chief Complaint  ?Patient presents with  ? Abnormal Lb  ? ?HPI: Brian Conner is a 51 y.o. male with medical history significant of diabetes mellitus type 2, hypertension presenting with abnormal labs per primary care blood work.  In the emergency room patient had a renal ultrasound that was negative for hydronephrosis or obstruction and normal otherwise.  Patient did have significant acute kidney injury with a creatinine of 3.52 with his previous kidney function at 1.11.  CBC shows mild anemia with a hemoglobin of 11.1 normal differential MCV of 94.1 and normal reticulocyte count platelet count of 156.  Urinalysis today shows small hemoglobin and 0-5 RBCs otherwise normal.  EKG today shows sinus rhythm 72 with symptoms nonspecific T wave changes in the lateral leads. ? ?Review of Systems  ?All other systems reviewed and are negative. ? ?Past Medical History:  ?Diagnosis Date  ? Diabetes mellitus without complication (HCC)   ? Hyperlipemia   ? Hypertension   ? ?Past Surgical History:  ?Procedure Laterality Date  ? CHOLECYSTECTOMY    ? COLONOSCOPY WITH PROPOFOL N/A 11/15/2020  ? Procedure: COLONOSCOPY WITH PROPOFOL;  Surgeon: Toledo, Boykin Nearing, MD;  Location: [facility] ENDOSCOPY;  Service: Gastroenterology;  Laterality: N/A;  ? ?Social History:  reports that he has been smoking cigarettes. He has been smoking an average of .5 packs per day. He has never used smokeless tobacco. He reports current alcohol use. He reports that he does not use drugs. ? ?Allergies  ?Allergen Reactions  ? Ibuprofen Nausea And Vomiting  ? Tramadol Nausea And Vomiting  ? ? ?History reviewed. No pertinent family history. ? ?Prior to Admission medications   ?Medication Sig Start Date End Date Taking? Authorizing Provider   ?amoxicillin (AMOXIL) 500 MG capsule Take 1 capsule (500 mg total) by mouth 3 (three) times daily. 09/28/18   Joni Reining, PA-C  ?aspirin EC 81 MG tablet Take 81 mg by mouth daily. Swallow whole.    [provider]  ?atenolol-chlorthalidone (TENORETIC) 50-25 MG tablet Take 1 tablet by mouth daily.    [provider]  ?chlorhexidine (PERIDEX) 0.12 % solution Use as directed 10 mLs in the mouth or throat 2 (two) times daily. Swish and spit 10/14/18   Cuthriell, Delorise Royals, PA-C  ?cholecalciferol (VITAMIN D3) 25 MCG (1000 UNIT) tablet Take 1,250 Units by mouth daily.    [provider]  ?clindamycin (CLEOCIN) 300 MG capsule Take 1 capsule (300 mg total) by mouth 4 (four) times daily. 10/14/18   Cuthriell, Delorise Royals, PA-C  ?clonazePAM (KLONOPIN) 0.5 MG tablet Take 0.5 mg by mouth 2 (two) times daily as needed for anxiety.    [provider]  ?dibucaine (NUPERCAINAL) 1 % OINT Place 1 application rectally as needed for hemorrhoids. 01/30/18   Willy Eddy, MD  ?empagliflozin (JARDIANCE) 25 MG TABS tablet Take by mouth daily.    [provider]  ?HYDROcodone-acetaminophen (NORCO/VICODIN) 5-325 MG tablet Take 1 tablet by mouth every 4 (four) hours as needed for moderate pain. 10/14/18   Cuthriell, Delorise Royals, PA-C  ?lidocaine (XYLOCAINE) 2 % solution Use as directed 10 mLs in the mouth or throat every 4 (four) hours as needed for mouth pain. Swish, gargle, and spit 10/14/18   Cuthriell, Delorise Royals, PA-C  ?lisinopril (ZESTRIL) 40  MG tablet Take 40 mg by mouth daily.    [provider]  ?metFORMIN (GLUCOPHAGE) 500 MG tablet Take by mouth 2 (two) times daily with a meal.    [provider]  ?oxyCODONE-acetaminophen (PERCOCET) 7.5-325 MG tablet Take 1 tablet by mouth every 6 (six) hours as needed for severe pain. 09/28/18   Joni ReiningSmith, Ronald K, PA-C  ?pantoprazole (PROTONIX) 40 MG tablet Take 40 mg by mouth daily.    [provider]  ?polyethylene glycol  (MIRALAX / GLYCOLAX) packet Take 17 g by mouth daily. Mix one tablespoon with 8oz of your favorite juice or water every day until you are having soft formed stools. Then start taking once daily if you didn't have a stool the day before. 01/30/18   Willy Eddyobinson, Patrick, MD  ?rosuvastatin (CRESTOR) 40 MG tablet Take 40 mg by mouth daily.    [provider]  ?sildenafil (REVATIO) 20 MG tablet Take 20 mg by mouth 3 (three) times daily.    [provider]  ? ? ?Physical Exam: ?Vitals:  ? 12/13/21 1741 12/13/21 1742  ?BP: 119/60   ?Pulse: 70   ?Resp: 20   ?Temp: 97.7 ?F (36.5 ?C)   ?TempSrc: Oral   ?SpO2: 94%   ?Weight:  129.3 kg  ?Height:  6\' 1"  (1.854 m)  ?Physical Exam ?Vitals and nursing note reviewed.  ?Constitutional:   ?   General: He is not in acute distress. ?   Appearance: Normal appearance. He is obese. He is not ill-appearing, toxic-appearing or diaphoretic.  ?HENT:  ?   Head: Normocephalic and atraumatic.  ?   Right Ear: Hearing and external ear normal.  ?   Left Ear: Hearing and external ear normal.  ?   Nose: Nose normal. No nasal deformity.  ?   Mouth/Throat:  ?   Lips: Pink.  ?   Mouth: Mucous membranes are moist.  ?   Tongue: No lesions.  ?   Pharynx: Oropharynx is clear.  ?Eyes:  ?   Extraocular Movements: Extraocular movements intact.  ?   Pupils: Pupils are equal, round, and reactive to light.  ?Cardiovascular:  ?   Rate and Rhythm: Normal rate and regular rhythm.  ?   Pulses: Normal pulses.  ?   Heart sounds: Normal heart sounds.  ?Pulmonary:  ?   Effort: Pulmonary effort is normal.  ?   Breath sounds: Normal breath sounds.  ?Abdominal:  ?   General: Bowel sounds are normal. There is no distension.  ?   Palpations: Abdomen is soft. There is no mass.  ?   Tenderness: There is no abdominal tenderness. There is no guarding.  ?   Hernia: No hernia is present.  ?Musculoskeletal:  ?   Right lower leg: No edema.  ?   Left lower leg: No edema.  ?Skin: ?   General: Skin is warm.  ?Neurological:   ?   General: No focal deficit present.  ?   Mental Status: He is alert and oriented to person, place, and time.  ?   Cranial Nerves: Cranial nerves 2-12 are intact.  ?   Motor: Motor function is intact.  ?Psychiatric:     ?   Attention and Perception: Attention normal.     ?   Mood and Affect: Mood normal.     ?   Speech: Speech normal.     ?   Behavior: Behavior normal. Behavior is cooperative.     ?   Cognition and Memory:  Cognition normal.  ? ? ?Data Reviewed: ?{ ?Results for orders placed or performed during the hospital encounter of 12/13/21 (from the past 24 hour(s))  ?CBC with Differential     Status: Abnormal  ? Collection Time: 12/13/21  5:48 PM  ?Result Value Ref Range  ? WBC 8.3 4.0 - 10.5 K/uL  ? RBC 3.71 (L) 4.22 - 5.81 MIL/uL  ? Hemoglobin 11.1 (L) 13.0 - 17.0 g/dL  ? HCT 34.9 (L) 39.0 - 52.0 %  ? MCV 94.1 80.0 - 100.0 fL  ? MCH 29.9 26.0 - 34.0 pg  ? MCHC 31.8 30.0 - 36.0 g/dL  ? RDW 13.3 11.5 - 15.5 %  ? Platelets 156 150 - 400 K/uL  ? nRBC 0.0 0.0 - 0.2 %  ? Neutrophils Relative % 58 %  ? Neutro Abs 4.8 1.7 - 7.7 K/uL  ? Lymphocytes Relative 34 %  ? Lymphs Abs 2.8 0.7 - 4.0 K/uL  ? Monocytes Relative 6 %  ? Monocytes Absolute 0.5 0.1 - 1.0 K/uL  ? Eosinophils Relative 1 %  ? Eosinophils Absolute 0.1 0.0 - 0.5 K/uL  ? Basophils Relative 0 %  ? Basophils Absolute 0.0 0.0 - 0.1 K/uL  ? Immature Granulocytes 1 %  ? Abs Immature Granulocytes 0.05 0.00 - 0.07 K/uL  ?Comprehensive metabolic panel     Status: Abnormal  ? Collection Time: 12/13/21  5:48 PM  ?Result Value Ref Range  ? Sodium 142 135 - 145 mmol/L  ? Potassium 3.7 3.5 - 5.1 mmol/L  ? Chloride 105 98 - 111 mmol/L  ? CO2 25 22 - 32 mmol/L  ? Glucose, Bld 136 (H) 70 - 99 mg/dL  ? BUN 54 (H) 6 - 20 mg/dL  ? Creatinine, Ser 3.52 (H) 0.61 - 1.24 mg/dL  ? Calcium 9.3 8.9 - 10.3 mg/dL  ? Total Protein 7.3 6.5 - 8.1 g/dL  ? Albumin 3.6 3.5 - 5.0 g/dL  ? AST 26 15 - 41 U/L  ? ALT 21 0 - 44 U/L  ? Alkaline Phosphatase 46 38 - 126 U/L  ? Total Bilirubin  0.7 0.3 - 1.2 mg/dL  ? GFR, Estimated 20 (L) >60 mL/min  ? Anion gap 12 5 - 15  ?Urinalysis, Routine w reflex microscopic Urine, Clean Catch     Status: Abnormal  ? Collection Time: 12/13/21  5:48 PM  ?Result Val

## 2021-12-13 NOTE — ED Provider Notes (Signed)
? ?[facility] ?Provider Note ? ? ? Event Date/Time  ? First MD Initiated Contact with Patient 12/13/21 2022   ?  (approximate) ? ? ?History  ? ?Abnormal Lb ? ? ?HPI ? ?Brian Conner is a 51 y.o. male who presents to the ED for evaluation of Abnormal Lb ?  ?I review GI clinic visit from 1 year ago.  Obese patient with history of HTN, HLD, DM and GERD. ? ?Patient presents to the ED at the direction of his PCP for evaluation of worsening renal dysfunction.  Reports seeing his PCP 1 week ago and had a GFR of 20, followed up today and it was 16 as an outpatient.  His metformin switched to Jardiance 1 week ago. ? ?Reports taking naproxen regularly for chronic back pain. ? ?Physical Exam  ? ?Triage Vital Signs: ?ED Triage Vitals  ?Enc Vitals Group  ?   BP 12/13/21 1741 119/60  ?   Pulse Rate 12/13/21 1741 70  ?   Resp 12/13/21 1741 20  ?   Temp 12/13/21 1741 97.7 ?F (36.5 ?C)  ?   Temp Source 12/13/21 1741 Oral  ?   SpO2 12/13/21 1741 94 %  ?   Weight 12/13/21 1742 285 lb (129.3 kg)  ?   Height 12/13/21 1742 6\' 1"  (1.854 m)  ?   Head Circumference --   ?   Peak Flow --   ?   Pain Score 12/13/21 1742 8  ?   Pain Loc --   ?   Pain Edu? --   ?   Excl. in GC? --   ? ? ?Most recent vital signs: ?Vitals:  ? 12/13/21 1741  ?BP: 119/60  ?Pulse: 70  ?Resp: 20  ?Temp: 97.7 ?F (36.5 ?C)  ?SpO2: 94%  ? ? ?General: Awake, no distress.  Ambulatory with normal gait ?CV:  Good peripheral perfusion.  ?Resp:  Normal effort.  ?Abd:  No distention.  Soft and benign ?MSK:  No deformity noted.  ?Neuro:  No focal deficits appreciated. ?Other:   ? ? ?ED Results / Procedures / Treatments  ? ?Labs ?(all labs ordered are listed, but only abnormal results are displayed) ?Labs Reviewed  ?CBC WITH DIFFERENTIAL/PLATELET - Abnormal; Notable for the following components:  ?    Result Value  ? RBC 3.71 (*)   ? Hemoglobin 11.1 (*)   ? HCT 34.9 (*)   ? All other components within normal limits  ?COMPREHENSIVE METABOLIC PANEL -  Abnormal; Notable for the following components:  ? Glucose, Bld 136 (*)   ? BUN 54 (*)   ? Creatinine, Ser 3.52 (*)   ? GFR, Estimated 20 (*)   ? All other components within normal limits  ?URINALYSIS, ROUTINE W REFLEX MICROSCOPIC - Abnormal; Notable for the following components:  ? Color, Urine STRAW (*)   ? APPearance CLEAR (*)   ? Glucose, UA >=500 (*)   ? Hgb urine dipstick SMALL (*)   ? Bacteria, UA RARE (*)   ? All other components within normal limits  ?CBC  ?HIV ANTIBODY (ROUTINE TESTING W REFLEX)  ?COMPREHENSIVE METABOLIC PANEL  ?CBC  ?HEMOGLOBIN A1C  ? ? ?EKG ?Sinus rhythm, rate 72 bpm.  Normal axis and intervals.  Evidence of acute ischemia. ? ?RADIOLOGY ? ? ?Official radiology report(s): ?02/12/22 Renal ? ?Result Date: 12/13/2021 ?CLINICAL DATA:  Acute kidney injury. EXAM: RENAL / URINARY TRACT ULTRASOUND COMPLETE COMPARISON:  None Available. FINDINGS: Right Kidney: Renal measurements: 12.2 cm  x 5.8 cm x 4.9 cm = volume: 180 mL. Echogenicity within normal limits. No mass or hydronephrosis visualized. Left Kidney: Renal measurements: 12.3 cm x 5.2 cm x 5.4 cm = volume: 180 mL. Echogenicity within normal limits. No mass or hydronephrosis visualized. Bladder: Appears normal for degree of bladder distention. Other: None. IMPRESSION: Normal renal ultrasound. Electronically Signed   By: Aram Candela M.D.   On: 12/13/2021 20:08   ? ?PROCEDURES and INTERVENTIONS: ? ?.1-3 Lead EKG Interpretation ?Performed by: Delton Prairie, MD ?Authorized by: Delton Prairie, MD  ? ?  Interpretation: normal   ?  ECG rate:  70 ?  ECG rate assessment: normal   ?  Rhythm: sinus rhythm   ?  Ectopy: none   ?  Conduction: normal   ? ?Medications  ?heparin injection 5,000 Units (has no administration in time range)  ?sodium chloride flush (NS) 0.9 % injection 3 mL (has no administration in time range)  ?0.9 %  sodium chloride infusion (has no administration in time range)  ?acetaminophen (TYLENOL) tablet 650 mg (has no administration in time  range)  ?  Or  ?acetaminophen (TYLENOL) suppository 650 mg (has no administration in time range)  ?insulin aspart (novoLOG) injection 0-9 Units (has no administration in time range)  ?pantoprazole (PROTONIX) injection 40 mg (has no administration in time range)  ?hydrALAZINE (APRESOLINE) injection 10 mg (has no administration in time range)  ?lactated ringers infusion (has no administration in time range)  ?lactated ringers bolus 1,000 mL (1,000 mLs Intravenous New Bag/Given 12/13/21 2055)  ?acetaminophen (TYLENOL) tablet 1,000 mg (1,000 mg Oral Given 12/13/21 2050)  ?oxyCODONE (Oxy IR/ROXICODONE) immediate release tablet 5 mg (5 mg Oral Given 12/13/21 2050)  ? ? ? ?IMPRESSION / MDM / ASSESSMENT AND PLAN / ED COURSE  ?I reviewed the triage vital signs and the nursing notes. ? ?51 year old male with metabolic syndrome presents to the ED with an AKI requiring medical admission.  Normal vitals and reassuring examination.  Blood work with mild normocytic anemia, AKI that appears prerenal.  No evidence of acute cystitis.  No history or clinical signs of obstructive ureterolithiasis.  Renal ultrasound without evidence of hydronephrosis or acute derangements.  We will initiate IV fluids and consult medicine for admission. ? ?  ? ? ?FINAL CLINICAL IMPRESSION(S) / ED DIAGNOSES  ? ?Final diagnoses:  ?AKI (acute kidney injury) (HCC)  ? ? ? ?Rx / DC Orders  ? ?ED Discharge Orders   ? ? None  ? ?  ? ? ? ?Note:  This document was prepared using Dragon voice recognition software and may include unintentional dictation errors. ?  ?Delton Prairie, MD ?12/13/21 2113 ? ?

## 2021-12-13 NOTE — ED Provider Triage Note (Signed)
Emergency Medicine Provider Triage Evaluation Note ? ?Brian Conner , a 51 y.o. male  was evaluated in triage.  Pt complains of abnormal lab.  Patient sent to ED from primary care for his GFR reportedly being 14.  Patient states he has some body aches but no other complaints.  No chronic kidney disease history. ? ?Review of Systems  ?Positive: Abnormal lab at primary care ?Negative: Dysuria, polyuria, hematuria, decreased urinary output ? ?Physical Exam  ?BP 119/60 (BP Location: Left Arm)   Pulse 70   Temp 97.7 ?F (36.5 ?C) (Oral)   Resp 20   Ht 6\' 1"  (1.854 m)   Wt 129.3 kg   SpO2 94%   BMI 37.60 kg/m?  ?Gen:   Awake, no distress   ?Resp:  Normal effort  ?MSK:   Moves extremities without difficulty  ?Other:   ? ?Medical Decision Making  ?Medically screening exam initiated at 5:50 PM.  Appropriate orders placed.  MANI CELESTIN was informed that the remainder of the evaluation will be completed by another provider, this initial triage assessment does not replace that evaluation, and the importance of remaining in the ED until their evaluation is complete. ? ?Patient will have labs ?  ?Brian Garbe, PA-C ?12/13/21 1751 ? ?

## 2021-12-14 DIAGNOSIS — N179 Acute kidney failure, unspecified: Secondary | ICD-10-CM | POA: Diagnosis not present

## 2021-12-14 LAB — CBC
HCT: 32.3 % — ABNORMAL LOW (ref 39.0–52.0)
Hemoglobin: 10.2 g/dL — ABNORMAL LOW (ref 13.0–17.0)
MCH: 29.7 pg (ref 26.0–34.0)
MCHC: 31.6 g/dL (ref 30.0–36.0)
MCV: 94.2 fL (ref 80.0–100.0)
Platelets: 142 10*3/uL — ABNORMAL LOW (ref 150–400)
RBC: 3.43 MIL/uL — ABNORMAL LOW (ref 4.22–5.81)
RDW: 13.4 % (ref 11.5–15.5)
WBC: 7.5 10*3/uL (ref 4.0–10.5)
nRBC: 0 % (ref 0.0–0.2)

## 2021-12-14 LAB — CBC WITH DIFFERENTIAL/PLATELET
Abs Immature Granulocytes: 0.04 10*3/uL (ref 0.00–0.07)
Basophils Absolute: 0 10*3/uL (ref 0.0–0.1)
Basophils Relative: 1 %
Eosinophils Absolute: 0.1 10*3/uL (ref 0.0–0.5)
Eosinophils Relative: 2 %
HCT: 34.1 % — ABNORMAL LOW (ref 39.0–52.0)
Hemoglobin: 11.2 g/dL — ABNORMAL LOW (ref 13.0–17.0)
Immature Granulocytes: 1 %
Lymphocytes Relative: 43 %
Lymphs Abs: 2.8 10*3/uL (ref 0.7–4.0)
MCH: 30.7 pg (ref 26.0–34.0)
MCHC: 32.8 g/dL (ref 30.0–36.0)
MCV: 93.4 fL (ref 80.0–100.0)
Monocytes Absolute: 0.5 10*3/uL (ref 0.1–1.0)
Monocytes Relative: 7 %
Neutro Abs: 3.1 10*3/uL (ref 1.7–7.7)
Neutrophils Relative %: 46 %
Platelets: 147 10*3/uL — ABNORMAL LOW (ref 150–400)
RBC: 3.65 MIL/uL — ABNORMAL LOW (ref 4.22–5.81)
RDW: 13.2 % (ref 11.5–15.5)
WBC: 6.6 10*3/uL (ref 4.0–10.5)
nRBC: 0 % (ref 0.0–0.2)

## 2021-12-14 LAB — MAGNESIUM: Magnesium: 1.9 mg/dL (ref 1.7–2.4)

## 2021-12-14 LAB — RESPIRATORY PANEL BY PCR

## 2021-12-14 LAB — COMPREHENSIVE METABOLIC PANEL
ALT: 17 U/L (ref 0–44)
AST: 23 U/L (ref 15–41)
Albumin: 3 g/dL — ABNORMAL LOW (ref 3.5–5.0)
Alkaline Phosphatase: 42 U/L (ref 38–126)
Anion gap: 7 (ref 5–15)
BUN: 44 mg/dL — ABNORMAL HIGH (ref 6–20)
CO2: 26 mmol/L (ref 22–32)
Calcium: 8.5 mg/dL — ABNORMAL LOW (ref 8.9–10.3)
Chloride: 107 mmol/L (ref 98–111)
Creatinine, Ser: 2.87 mg/dL — ABNORMAL HIGH (ref 0.61–1.24)
GFR, Estimated: 26 mL/min — ABNORMAL LOW (ref >60)
Glucose, Bld: 140 mg/dL — ABNORMAL HIGH (ref 70–99)
Potassium: 4 mmol/L (ref 3.5–5.1)
Sodium: 140 mmol/L (ref 135–145)
Total Bilirubin: 0.7 mg/dL (ref 0.3–1.2)
Total Protein: 6.1 g/dL — ABNORMAL LOW (ref 6.5–8.1)

## 2021-12-14 LAB — CBG MONITORING, ED
Glucose-Capillary: 128 mg/dL — ABNORMAL HIGH (ref 70–99)
Glucose-Capillary: 143 mg/dL — ABNORMAL HIGH (ref 70–99)

## 2021-12-14 LAB — URINE DRUG SCREEN, QUALITATIVE (ARMC ONLY)
Amphetamines, Ur Screen: NOT DETECTED
Barbiturates, Ur Screen: NOT DETECTED
Benzodiazepine, Ur Scrn: POSITIVE — AB
Cannabinoid 50 Ng, Ur ~~LOC~~: NOT DETECTED
Cocaine Metabolite,Ur ~~LOC~~: NOT DETECTED
MDMA (Ecstasy)Ur Screen: NOT DETECTED
Methadone Scn, Ur: NOT DETECTED
Opiate, Ur Screen: NOT DETECTED
Phencyclidine (PCP) Ur S: NOT DETECTED
Tricyclic, Ur Screen: NOT DETECTED

## 2021-12-14 LAB — HEMOGLOBIN A1C
Hgb A1c MFr Bld: 6.7 % — ABNORMAL HIGH (ref 4.8–5.6)
Mean Plasma Glucose: 145.59 mg/dL

## 2021-12-14 LAB — GLUCOSE, CAPILLARY
Glucose-Capillary: 97 mg/dL (ref 70–99)
Glucose-Capillary: 139 mg/dL — ABNORMAL HIGH (ref 70–99)

## 2021-12-14 LAB — PHOSPHORUS: Phosphorus: 4.3 mg/dL (ref 2.5–4.6)

## 2021-12-14 LAB — HIV ANTIBODY (ROUTINE TESTING W REFLEX): HIV Screen 4th Generation wRfx: NONREACTIVE

## 2021-12-14 MED ORDER — HYDROCODONE-ACETAMINOPHEN 5-325 MG PO TABS
1.0000 | ORAL_TABLET | ORAL | Status: DC | PRN
  Administered 2021-12-14 – 2021-12-15 (×4): 1 via ORAL
  Filled 2021-12-14 (×4): qty 1

## 2021-12-14 MED ORDER — LORAZEPAM 1 MG PO TABS: 0.0000 mg | ORAL_TABLET | Freq: Two times a day (BID) | ORAL | Status: DC

## 2021-12-14 MED ORDER — HYDROCODONE-ACETAMINOPHEN 5-325 MG PO TABS
1.0000 | ORAL_TABLET | Freq: Once | ORAL | Status: AC
  Administered 2021-12-14: 1 via ORAL
  Filled 2021-12-14: qty 1

## 2021-12-14 MED ORDER — LORAZEPAM 1 MG PO TABS: 0.0000 mg | ORAL_TABLET | Freq: Four times a day (QID) | ORAL | Status: DC

## 2021-12-14 MED ORDER — THIAMINE HCL 100 MG PO TABS
100.0000 mg | ORAL_TABLET | Freq: Every day | ORAL | Status: DC
  Administered 2021-12-14 – 2021-12-15 (×2): 100 mg via ORAL
  Filled 2021-12-14 (×2): qty 1

## 2021-12-14 MED ORDER — TRAMADOL HCL 50 MG PO TABS
50.0000 mg | ORAL_TABLET | Freq: Four times a day (QID) | ORAL | Status: DC | PRN
  Filled 2021-12-14: qty 1

## 2021-12-14 MED ORDER — FOLIC ACID 1 MG PO TABS
1.0000 mg | ORAL_TABLET | Freq: Every day | ORAL | Status: DC
  Administered 2021-12-14 – 2021-12-15 (×2): 1 mg via ORAL
  Filled 2021-12-14 (×2): qty 1

## 2021-12-14 MED ORDER — THIAMINE HCL 100 MG/ML IJ SOLN: 100.0000 mg | Freq: Every day | INTRAMUSCULAR | Status: DC

## 2021-12-14 MED ORDER — ADULT MULTIVITAMIN W/MINERALS CH
1.0000 | ORAL_TABLET | Freq: Every day | ORAL | Status: DC
  Administered 2021-12-14 – 2021-12-15 (×2): 1 via ORAL
  Filled 2021-12-14 (×2): qty 1

## 2021-12-14 MED ORDER — LORAZEPAM 2 MG/ML IJ SOLN: 1.0000 mg | INTRAMUSCULAR | Status: DC | PRN

## 2021-12-14 MED ORDER — LORAZEPAM 1 MG PO TABS: 1.0000 mg | ORAL_TABLET | ORAL | Status: DC | PRN

## 2021-12-14 MED ORDER — NICOTINE 21 MG/24HR TD PT24
21.0000 mg | MEDICATED_PATCH | Freq: Every day | TRANSDERMAL | Status: DC
  Administered 2021-12-14 – 2021-12-15 (×2): 21 mg via TRANSDERMAL
  Filled 2021-12-14 (×2): qty 1

## 2021-12-14 NOTE — Consult Note (Signed)
?[facility]  ?CONSULT NOTE  ? ? ?Date: 12/14/2021      ?      ?      ?Patient Name:  Brian Conner  MRN: [id number]  ?DOB: 11-04-1970  Age / Sex: 51 y.o., male   ?      ?PCP: Remi Haggard, FNP     ?      ?      ?Service Requesting Consult: TRH     ?      ?      ?Reason for Consult: Acute kidney injury     ?      ? ?History of Present Illness: ?Mr. Brian Conner is a 51 y.o.  male with past medical history of diabetes and hypertension, who was admitted to [facility] on 12/13/2021 for AKI (acute kidney injury) ([place]) [N17.9] ? ?Patient seen sitting at bedside with wife.  Patient states he presented to the emergency room at the advice of his PCP due to abnormal labs.  Patient states he has recently suffered with nausea and vomiting that has worsened over the past couple weeks.  Denies diarrhea.  Does endorse lower back pain that he was taking NSAID for.  Patient states he has been taking NSAID for 2 to 3 weeks, 2 pills daily however not every day.  Patient states he has not taken NSAID for 1 week because it stopped working.  Lower back pain remains.  Patient states he has had poor appetite due to nausea and vomiting.  Denies cough, fever, and chills. ? ?Labs on ED arrival include glucose 136, BUN 54, and creatinine 3.52 with GFR 20.  Hemoglobin 11.1.  Renal ultrasound negative for obstruction.  Respiratory panel pending. ? ? ?Medications: ?Outpatient medications: ?(Not in a hospital admission) ? ? ?Current medications: ?Current Facility-Administered Medications  ?Medication Dose Route Frequency Provider Last Rate Last Admin  ? 0.9 %  sodium chloride infusion  20 mL/hr Intravenous Continuous Para Skeans, MD      ? acetaminophen (TYLENOL) tablet 650 mg  650 mg Oral Q6H PRN Para Skeans, MD   650 mg at 12/14/21 0740  ? Or  ? acetaminophen (TYLENOL) suppository 650 mg  650 mg Rectal Q6H PRN Para Skeans, MD      ? folic acid (FOLVITE) tablet 1 mg  1 mg Oral Daily Swayze, Ava, DO   1 mg at 12/14/21  1337  ? heparin injection 5,000 Units  5,000 Units Subcutaneous Q8H Florina Ou V, MD   5,000 Units at 12/14/21 1338  ? hydrALAZINE (APRESOLINE) injection 10 mg  10 mg Intravenous Q6H Florina Ou V, MD      ? insulin aspart (novoLOG) injection 0-9 Units  0-9 Units Subcutaneous TID WC Para Skeans, MD      ? lactated ringers infusion   Intravenous Continuous Para Skeans, MD 100 mL/hr at 12/14/21 0737 New Bag at 12/14/21 0737  ? LORazepam (ATIVAN) tablet 1-4 mg  1-4 mg Oral Q1H PRN Swayze, Ava, DO      ? Or  ? LORazepam (ATIVAN) injection 1-4 mg  1-4 mg Intravenous Q1H PRN Swayze, Ava, DO      ? LORazepam (ATIVAN) tablet 0-4 mg  0-4 mg Oral Q6H Swayze, Ava, DO      ? Followed by  ? [START ON 12/16/2021] LORazepam (ATIVAN) tablet 0-4 mg  0-4 mg Oral Q12H Swayze, Ava, DO      ? multivitamin with minerals tablet  1 tablet  1 tablet Oral Daily Swayze, Ava, DO   1 tablet at 12/14/21 1338  ? nicotine (NICODERM CQ - dosed in mg/24 hours) patch 21 mg  21 mg Transdermal Daily Swayze, Ava, DO   21 mg at 12/14/21 0900  ? pantoprazole (PROTONIX) injection 40 mg  40 mg Intravenous Q12H Para Skeans, MD   40 mg at 12/14/21 0739  ? sodium chloride flush (NS) 0.9 % injection 3 mL  3 mL Intravenous Q12H Para Skeans, MD   3 mL at 12/14/21 0739  ? thiamine tablet 100 mg  100 mg Oral Daily Swayze, Ava, DO   100 mg at 12/14/21 1337  ? Or  ? thiamine (B-1) injection 100 mg  100 mg Intravenous Daily Swayze, Ava, DO      ? traMADol (ULTRAM) tablet 50 mg  50 mg Oral Q6H PRN Swayze, Ava, DO      ? ?Current Outpatient Medications  ?Medication Sig Dispense Refill  ? aspirin EC 81 MG tablet Take 81 mg by mouth daily. Swallow whole.    ? atenolol-chlorthalidone (TENORETIC) 50-25 MG tablet Take 1 tablet by mouth daily.    ? D3-50 1.25 MG (50000 UT) capsule Take 50,000 Units by mouth once a week.    ? empagliflozin (JARDIANCE) 25 MG TABS tablet Take 25 mg by mouth daily.    ? folic acid (FOLVITE) 1 MG tablet Take 1 mg by mouth daily.    ?  lisinopril (ZESTRIL) 40 MG tablet Take 40 mg by mouth daily.    ? pantoprazole (PROTONIX) 40 MG tablet Take 40 mg by mouth daily.    ? rosuvastatin (CRESTOR) 40 MG tablet Take 40 mg by mouth daily.    ? amoxicillin (AMOXIL) 500 MG capsule Take 1 capsule (500 mg total) by mouth 3 (three) times daily. (Patient not taking: Reported on 12/13/2021) 30 capsule 0  ? chlorhexidine (PERIDEX) 0.12 % solution Use as directed 10 mLs in the mouth or throat 2 (two) times daily. Swish and spit (Patient not taking: Reported on 12/13/2021) 120 mL 0  ? clindamycin (CLEOCIN) 300 MG capsule Take 1 capsule (300 mg total) by mouth 4 (four) times daily. (Patient not taking: Reported on 12/13/2021) 28 capsule 0  ? clonazePAM (KLONOPIN) 0.5 MG tablet Take 0.5-1.5 mg by mouth 2 (two) times daily as needed for anxiety.    ? dibucaine (NUPERCAINAL) 1 % OINT Place 1 application rectally as needed for hemorrhoids. (Patient not taking: Reported on 12/13/2021) 28 g 1  ? Finerenone (KERENDIA) 20 MG TABS Take 20 mg by mouth daily. (Patient not taking: Reported on 12/14/2021)    ? HYDROcodone-acetaminophen (NORCO/VICODIN) 5-325 MG tablet Take 1 tablet by mouth every 4 (four) hours as needed for moderate pain. (Patient not taking: Reported on 12/14/2021) 12 tablet 0  ? lidocaine (XYLOCAINE) 2 % solution Use as directed 10 mLs in the mouth or throat every 4 (four) hours as needed for mouth pain. Swish, gargle, and spit (Patient not taking: Reported on 12/13/2021) 200 mL 0  ? oxyCODONE-acetaminophen (PERCOCET) 7.5-325 MG tablet Take 1 tablet by mouth every 6 (six) hours as needed for severe pain. (Patient not taking: Reported on 12/14/2021) 12 tablet 0  ? polyethylene glycol (MIRALAX / GLYCOLAX) packet Take 17 g by mouth daily. Mix one tablespoon with 8oz of your favorite juice or water every day until you are having soft formed stools. Then start taking once daily if you didn't have a stool the day before. (Patient  not taking: Reported on 12/13/2021) 30 each 0  ?   ? ? ?Allergies: ?Allergies  ?Allergen Reactions  ? Ibuprofen Nausea And Vomiting  ? Tramadol Nausea And Vomiting  ?  ? ? ?Past Medical History: ?Past Medical History:  ?Diagnosis Date  ? Diabetes mellitus without complication ([place])   ? Hyperlipemia   ? Hypertension   ? ? ? ?Past Surgical History: ?Past Surgical History:  ?Procedure Laterality Date  ? CHOLECYSTECTOMY    ? COLONOSCOPY WITH PROPOFOL N/A 11/15/2020  ? Procedure: COLONOSCOPY WITH PROPOFOL;  Surgeon: Toledo, Benay Pike, MD;  Location: [facility] ENDOSCOPY;  Service: Gastroenterology;  Laterality: N/A;  ? ? ? ?Family History: ?History reviewed. No pertinent family history. ? ? ?Social History: ?Social History  ? ?Socioeconomic History  ? Marital status: Married  ?  Spouse name: Not on file  ? Number of children: Not on file  ? Years of education: Not on file  ? Highest education level: Not on file  ?Occupational History  ? Not on file  ?Tobacco Use  ? Smoking status: Every Day  ?  Packs/day: 0.50  ?  Types: Cigarettes  ? Smokeless tobacco: Never  ?Vaping Use  ? Vaping Use: Never used  ?Substance and Sexual Activity  ? Alcohol use: Yes  ?  Comment: seldom  ? Drug use: Never  ? Sexual activity: Not on file  ?Other Topics Concern  ? Not on file  ?Social History Narrative  ? Not on file  ? ?Social Determinants of Health  ? ?Financial Resource Strain: Not on file  ?Food Insecurity: Not on file  ?Transportation Needs: Not on file  ?Physical Activity: Not on file  ?Stress: Not on file  ?Social Connections: Not on file  ?Intimate Partner Violence: Not on file  ? ? ? ?Review of Systems: ?Review of Systems  ?Constitutional:  Negative for chills, fever and malaise/fatigue.  ?HENT:  Negative for congestion, sore throat and tinnitus.   ?Eyes:  Negative for blurred vision and redness.  ?Respiratory:  Negative for cough, shortness of breath and wheezing.   ?Cardiovascular:  Negative for chest pain, palpitations, claudication and leg swelling.  ?Gastrointestinal:  Negative for  abdominal pain, blood in stool, diarrhea, nausea and vomiting.  ?Genitourinary:  Negative for flank pain, frequency and hematuria.  ?Musculoskeletal:  Negative for back pain, falls and myalgias.  ?Skin:  Negative for r

## 2021-12-14 NOTE — Progress Notes (Signed)
Patient c/o generalized pain all over and back pain 8 out of 10. MD notified. Orders given for PRN tramadol. Tramadol taken to patient and patient refused and stated he cannot take it that it makes him sick and that there is an allergy listed in his chart. MD made aware. Order given for a one time dose of Norco.  ?

## 2021-12-14 NOTE — Progress Notes (Signed)
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 MD made aware. CIWA assessment completed. Endorses that he does want to quit drinking. Will place [facility] consult also.  ?

## 2021-12-14 NOTE — Progress Notes (Signed)
?PROGRESS NOTE ? ?NATURE BRAVERMAN [id number] DOB: 1970-08-23 DOA: 12/13/2021 ?PCP: Remi Haggard, FNP ? ?Brief History   ?Brian Conner is a 51 y.o. male with medical history significant of diabetes mellitus type 2, hypertension presenting with abnormal labs per primary care blood work.  In the emergency room patient had a renal ultrasound that was negative for hydronephrosis or obstruction and normal otherwise.  Patient did have significant acute kidney injury with a creatinine of 3.52 with his previous kidney function at 1.11.  CBC shows mild anemia with a hemoglobin of 11.1 normal differential MCV of 94.1 and normal reticulocyte count platelet count of 156.  Urinalysis today shows small hemoglobin and 0-5 RBCs otherwise normal.  EKG today shows sinus rhythm 72 with symptoms nonspecific T wave changes in the lateral leads. ? ?[facility] were consulted to admit the patient for further evaluation and treatment. ? ?Renal ultrasound is negative for signs of renal infarction or hydroureter or hydronephrosis. It was normal. Nephrology has been consulted. ? ?The patient is complaining of back/kidney pain. He is asking for oxycodone. He refused ultram. He is getting hydrocodone. ? ?Consultants  ?Nephrology ? ?Procedures  ?None ? ?Antibiotics  ? ?Anti-infectives (From admission, onward)  ? ? None  ? ?  ? ?Subjective  ?The patient is resting comfortably. No new complaints. ? ?Objective  ? ?Vitals:  ?Vitals:  ? 12/14/21 1330 12/14/21 1433  ?BP: 127/85 125/66  ?Pulse: 69 (!) 58  ?Resp: (!) 24 19  ?Temp:  97.8 ?F (36.6 ?C)  ?SpO2: 94% 95%  ? ? ?Exam: ? ?Constitutional:  ?The patient is awake, alert, and oriented x 3. No acute distress. ?Respiratory:  ?No increased work of breathing. ?No wheezes, rales, or rhonchi ?No tactile fremitus ?Cardiovascular:  ?Regular rate and rhythm ?No murmurs, ectopy, or gallups. ?No lateral PMI. No thrills. ?Abdomen:  ?Abdomen is soft, non-tender, non-distended ?No hernias, masses, or  organomegaly ?Normoactive bowel sounds.  ?Musculoskeletal:  ?No cyanosis, clubbing, or edema ?Skin:  ?No rashes, lesions, ulcers ?palpation of skin: no induration or nodules ?Neurologic:  ?CN 2-12 intact ?Sensation all 4 extremities intact ?Psychiatric:  ?Mental status ?Mood, affect appropriate ?Orientation to person, place, time  ?judgment and insight appear intact ? ?I have personally reviewed the following:  ? ?Today's Data  ?Vitals ? ?Lab Data  ?CBC, CMP ? ?Micro Data  ?Respiratory viral panel: pending. ? ?Imaging  ?Renal ultrasound ? ?Cardiology Data  ?EKG ? ?Other Data  ? ? ?Scheduled Meds: ? folic acid  1 mg Oral Daily  ? heparin  5,000 Units Subcutaneous Q8H  ? hydrALAZINE  10 mg Intravenous Q6H  ? insulin aspart  0-9 Units Subcutaneous TID WC  ? LORazepam  0-4 mg Oral Q6H  ? Followed by  ? [START ON 12/16/2021] LORazepam  0-4 mg Oral Q12H  ? multivitamin with minerals  1 tablet Oral Daily  ? nicotine  21 mg Transdermal Daily  ? pantoprazole (PROTONIX) IV  40 mg Intravenous Q12H  ? sodium chloride flush  3 mL Intravenous Q12H  ? thiamine  100 mg Oral Daily  ? Or  ? thiamine  100 mg Intravenous Daily  ? ?Continuous Infusions: ? sodium chloride    ? lactated ringers 100 mL/hr at 12/14/21 1557  ? ? ?Principal Problem: ?  AKI (acute kidney injury) ([place]) ?Active Problems: ?  DM II (diabetes mellitus, type II), controlled ([place]) ?  HTN (hypertension) ? ? ?A & P  ?AKI (acute kidney injury) ([place]) ?Attribute patient's  acute kidney injury to his chlorthalidone, lisinopril, metformin, Jardiance. ?We will hold all diuretics and renally cleared blood pressure meds including lisinopril and metformin along with Jardiance currently. ?Patient will start hydralazine overnight for blood pressure control. ?We will renal ultrasound within normal limits. ?We will continue to hydrate overnight with LR. ?Nephrology consult as deemed appropriate per a.m. team. ?Suspect mostly prerenal etiology 90% and may be renal 10% as I do not have  a kidney function or creatinine in the past 2 years in our records. ?  ?DM II (diabetes mellitus, type II), controlled ([place]) ?Suspect patient may have mild CKD if that underlying with a normal creatinine mildly decreased GFR at baseline however we have no way to document that. ?For his diabetes we will currently stop his metformin and Jardiance and continue patient on sliding scale insulin regimen and glycemic protocol. ?Discharge plan with Januvia insulin as per provider preference. ?  ?HTN (hypertension) ?Blood pressure 119/60, pulse 70, temperature 97.7 ?F (36.5 ?C), temperature source Oral, resp. rate 20, height 6\' 1"  (1.854 m), weight 129.3 kg, SpO2 94 %. ?Blood pressure is low currently we will hold patient's Tenoretic and lisinopril. ? ?I have seen and examined this patient myself I have spent 34 minutes in her evaluation and care. ? ?DVT prophylaxis: Heparin ?Code Status: Full Code ?Family Communication: None available ?Disposition Plan: home  ? ? ?Brian Narine, DO ?[facility] ?Direct contact: see [email or web address]  ?7PM-7AM contact night coverage as above ?12/14/2021, 6:24 PM  LOS: 0 days  ? LOS: 0 days  ? ? ? ? ? ? ?  ?

## 2021-12-15 ENCOUNTER — Observation Stay: Payer: Medicaid Other

## 2021-12-15 DIAGNOSIS — N179 Acute kidney failure, unspecified: Secondary | ICD-10-CM | POA: Diagnosis not present

## 2021-12-15 LAB — COMPREHENSIVE METABOLIC PANEL
ALT: 16 U/L (ref 0–44)
AST: 22 U/L (ref 15–41)
Albumin: 3.2 g/dL — ABNORMAL LOW (ref 3.5–5.0)
Alkaline Phosphatase: 40 U/L (ref 38–126)
Anion gap: 7 (ref 5–15)
BUN: 31 mg/dL — ABNORMAL HIGH (ref 6–20)
CO2: 28 mmol/L (ref 22–32)
Calcium: 8.7 mg/dL — ABNORMAL LOW (ref 8.9–10.3)
Chloride: 105 mmol/L (ref 98–111)
Creatinine, Ser: 2.11 mg/dL — ABNORMAL HIGH (ref 0.61–1.24)
GFR, Estimated: 37 mL/min — ABNORMAL LOW (ref >60)
Glucose, Bld: 161 mg/dL — ABNORMAL HIGH (ref 70–99)
Potassium: 3.8 mmol/L (ref 3.5–5.1)
Sodium: 140 mmol/L (ref 135–145)
Total Bilirubin: 0.2 mg/dL — ABNORMAL LOW (ref 0.3–1.2)
Total Protein: 6.5 g/dL (ref 6.5–8.1)

## 2021-12-15 LAB — GLUCOSE, CAPILLARY
Glucose-Capillary: 104 mg/dL — ABNORMAL HIGH (ref 70–99)
Glucose-Capillary: 116 mg/dL — ABNORMAL HIGH (ref 70–99)

## 2021-12-15 MED ORDER — ADULT MULTIVITAMIN W/MINERALS CH: 1.0000 | ORAL_TABLET | Freq: Every day | ORAL | 0 refills | Status: AC

## 2021-12-15 MED ORDER — METHOCARBAMOL 500 MG PO TABS: 750.0000 mg | ORAL_TABLET | Freq: Four times a day (QID) | ORAL | Status: DC | PRN

## 2021-12-15 MED ORDER — NICOTINE 21 MG/24HR TD PT24: 21.0000 mg | MEDICATED_PATCH | Freq: Every day | TRANSDERMAL | 0 refills | Status: AC

## 2021-12-15 MED ORDER — METHOCARBAMOL 750 MG PO TABS: 750.0000 mg | ORAL_TABLET | Freq: Four times a day (QID) | ORAL | 0 refills | Status: AC | PRN

## 2021-12-15 MED ORDER — THIAMINE HCL 100 MG PO TABS
100.0000 mg | ORAL_TABLET | Freq: Every day | ORAL | 0 refills | Status: AC
Start: 2021-12-16 — End: ?

## 2021-12-15 NOTE — Discharge Summary (Signed)
?Physician Discharge Summary ?  ?Patient: Brian Conner MRN: [id number] DOB: [birth year]  ?Admit date:     12/13/2021  ?Discharge date: 12/15/2021  ?Discharge Physician: Nichole Neyer  ? ?PCP: Armando Gang, FNP  ? ?Recommendations at discharge:  ? ? Discharge to home ?Follow up with PCP in 7-10 days. ?Have chemistry checked at that visit.  ?Keep appointment with nephrology. ?Avoid NSAIDS. ? ?Discharge Diagnoses: ?Principal Problem: ?  AKI (acute kidney injury) ([facility]) ?Active Problems: ?  DM II (diabetes mellitus, type II), controlled ([facility]) ?  HTN (hypertension) ? ?Resolved Problems: ?  * No resolved hospital problems. * ? ?Hospital Course: ?Brian Conner is a 51 y.o. male with medical history significant of diabetes mellitus type 2, hypertension presenting with abnormal labs per primary care blood work.  In the emergency room patient had a renal ultrasound that was negative for hydronephrosis or obstruction and normal otherwise.  Patient did have significant acute kidney injury with a creatinine of 3.52 with his previous kidney function at 1.11.  CBC shows mild anemia with a hemoglobin of 11.1 normal differential MCV of 94.1 and normal reticulocyte count platelet count of 156.  Urinalysis today shows small hemoglobin and 0-5 RBCs otherwise normal.  EKG today shows sinus rhythm 72 with symptoms nonspecific T wave changes in the lateral leads. ?  ?[facility] were consulted to admit the patient for further evaluation and treatment. ?  ?Renal ultrasound is negative for signs of renal infarction or hydroureter or hydronephrosis. It was normal. Nephrology has been consulted. ?  ?The patient is complaining of back/kidney pain. He is asking for oxycodone. He refused ultram. He is getting hydrocodone. ? ?X-ray of the patient's L-spine demonstrated only degenerative changes.  ? ?On 12/15/2021 the patient's creatinine has decreased to 2.11. It was 3.67 on admission.  ? ?The patient wants to go home. He has promised to drink  plenty of water and to keep appointment with nephrology next week.  ? ?Assessment and Plan: ?* AKI (acute kidney injury) (HCC) ?Attribute patient's acute kidney injury to his chlorthalidone, lisinopril, metformin, Jardiance. ?We will hold all diuretics and renally cleared blood pressure meds including lisinopril and metformin along with Jardiance currently. ?Patient will start hydralazine overnight for blood pressure control. ?We will renal ultrasound within normal limits. ?We will continue to hydrate overnight with LR. ?Nephrology consult as deemed appropriate per a.m. team. ?Suspect mostly prerenal etiology 90% and may be renal 10% as I do not have a kidney function or creatinine in the past 2 years in our records. ? ?DM II (diabetes mellitus, type II), controlled ([facility]) ?Suspect patient may have mild CKD if that underlying with a normal creatinine mildly decreased GFR at baseline however we have no way to document that. ?For his diabetes we will currently stop his metformin and Jardiance and continue patient on sliding scale insulin regimen and glycemic protocol. ?Discharge plan with Januvia insulin as per provider preference. ? ?HTN (hypertension) ?Blood pressure 119/60, pulse 70, temperature 97.7 ?F (36.5 ?C), temperature source Oral, resp. rate 20, height 6\' 1"  (1.854 m), weight 129.3 kg, SpO2 94 %. ?Blood pressure is low currently we will hold patient's Tenoretic and lisinopril. ? ? ?Consultants: Nephrology ?Procedures performed: None  ?Disposition: Home ?Diet recommendation:  ?Discharge Diet Orders (From admission, onward)  ? ?  Start     Ordered  ? 12/15/21 0000  Diet - low sodium heart healthy       ? 12/15/21 1641  ? ?  ?  ? ?  ? ?  Cardiac and Carb modified diet ?DISCHARGE MEDICATION: ?Allergies as of 12/15/2021   ? ?   Reactions  ? Ibuprofen Nausea And Vomiting  ? Tramadol Nausea And Vomiting  ? ?  ? ?  ?Medication List  ?  ? ?STOP taking these medications   ? ?amoxicillin 500 MG capsule ?Commonly known as:  AMOXIL ?  ?atenolol-chlorthalidone 50-25 MG tablet ?Commonly known as: TENORETIC ?  ?chlorhexidine 0.12 % solution ?Commonly known as: PERIDEX ?  ?clindamycin 300 MG capsule ?Commonly known as: CLEOCIN ?  ?empagliflozin 25 MG Tabs tablet ?Commonly known as: JARDIANCE ?  ?HYDROcodone-acetaminophen 5-325 MG tablet ?Commonly known as: NORCO/VICODIN ?  ?Kerendia 20 MG Tabs ?Generic drug: Finerenone ?  ?lidocaine 2 % solution ?Commonly known as: XYLOCAINE ?  ?lisinopril 40 MG tablet ?Commonly known as: ZESTRIL ?  ?oxyCODONE-acetaminophen 7.5-325 MG tablet ?Commonly known as: Percocet ?  ?polyethylene glycol 17 g packet ?Commonly known as: MIRALAX / GLYCOLAX ?  ? ?  ? ?TAKE these medications   ? ?aspirin EC 81 MG tablet ?Take 81 mg by mouth daily. Swallow whole. ?  ?clonazePAM 0.5 MG tablet ?Commonly known as: KLONOPIN ?Take 0.5-1.5 mg by mouth 2 (two) times daily as needed for anxiety. ?  ?D3-50 1.25 MG (50000 UT) capsule ?Generic drug: Cholecalciferol ?Take 50,000 Units by mouth once a week. ?  ?dibucaine 1 % Oint ?Commonly known as: NUPERCAINAL ?Place 1 application rectally as needed for hemorrhoids. ?  ?folic acid 1 MG tablet ?Commonly known as: FOLVITE ?Take 1 mg by mouth daily. ?  ?methocarbamol 750 MG tablet ?Commonly known as: ROBAXIN ?Take 1 tablet (750 mg total) by mouth every 6 (six) hours as needed for muscle spasms. ?  ?multivitamin with minerals Tabs tablet ?Take 1 tablet by mouth daily. ?Start taking on: Dec 16, 2021 ?  ?nicotine 21 mg/24hr patch ?Commonly known as: NICODERM CQ - dosed in mg/24 hours ?Place 1 patch (21 mg total) onto the skin daily. ?Start taking on: Dec 16, 2021 ?  ?pantoprazole 40 MG tablet ?Commonly known as: PROTONIX ?Take 40 mg by mouth daily. ?  ?rosuvastatin 40 MG tablet ?Commonly known as: CRESTOR ?Take 40 mg by mouth daily. ?  ?thiamine 100 MG tablet ?Take 1 tablet (100 mg total) by mouth daily. ?Start taking on: Dec 16, 2021 ?  ? ?  ? ? Follow-up Information   ? ? Armando Gang,  FNP Follow up in 1 week(s).   ?Specialty: Family Medicine ?Why: Check chemistry at this visit. Also revisit BP meds and Jardiance to restart if creatinine is returned to normal. ?Contact information: ?[address] ?[place] [address] ?[phone] ? ? ?  ?  ? ?  ?  ? ?  ? ?Discharge Exam: ?Filed Weights  ? 12/13/21 1742 12/15/21 0457  ?Weight: 129.3 kg 133.2 kg  ? ?Exam: ? ?Constitutional:  ?The patient is awake, alert, and oriented x 3. No acute distress. ?Respiratory:  ?No increased work of breathing. ?No wheezes, rales, or rhonchi ?No tactile fremitus ?Cardiovascular:  ?Regular rate and rhythm ?No murmurs, ectopy, or gallups. ?No lateral PMI. No thrills. ?Abdomen:  ?Abdomen is soft, non-tender, non-distended ?No hernias, masses, or organomegaly ?Normoactive bowel sounds.  ?Musculoskeletal:  ?No cyanosis, clubbing, or edema ?Skin:  ?No rashes, lesions, ulcers ?palpation of skin: no induration or nodules ?Neurologic:  ?CN 2-12 intact ?Sensation all 4 extremities intact ?Psychiatric:  ?Mental status ?Mood, affect appropriate ?Orientation to person, place, time  ?judgment and insight appear intact ? ? ?Condition at  discharge: fair ? ?The results of significant diagnostics from this hospitalization (including imaging, microbiology, ancillary and laboratory) are listed below for reference.  ? ?Imaging Studies: ?DG Lumbar Spine Complete ? ?Result Date: 12/15/2021 ?CLINICAL DATA:  Low back pain EXAM: LUMBAR SPINE - COMPLETE 4+ VIEW COMPARISON:  None Available. FINDINGS: 5 nonrib bearing lumbar-type vertebral bodies. Vertebral body heights are maintained. No acute fracture. Generalized osteopenia. No static listhesis. No spondylolysis. Degenerative disease with disc loss at T12-L1 and L5-S1. Bilateral facet arthropathy at L5-S1. SI joints are unremarkable. IMPRESSION: 1. Mild lumbar spine spondylosis as described above. Electronically Signed   By: Elige KoHetal  Patel M.D.   On: 12/15/2021 11:48  ? ?US Renal ? ?Result Date:  12/13/2021 ?CLINICAL DATA:  Acute kidney injury. EXAM: RENAL / URINARY TRACT ULTRASOUND COMPLETE COMPARISON:  None Available. FINDINGS: Right Kidney: Renal measurements: 12.2 cm x 5.8 cm x 4.9 cm = vol

## 2021-12-15 NOTE — Progress Notes (Addendum)
?[facility]  ?ROUNDING NOTE  ? ?Subjective:  ? ?Patient seen resting quietly, wife at bedside ?Denies nausea and vomiting since admission ?Appetite remains appropriate ?Continues to complain of right lower back pain ? ?Creatinine 2.11 ?Urine output 1.65 L overnight ? ?Objective:  ?Vital signs in last 24 hours:  ?Temp:  [97.6 ?F (36.4 ?C)-98.7 ?F (37.1 ?C)] 97.6 ?F (36.4 ?C) (05/04 1610) ?Pulse Rate:  [58-68] 61 (05/04 1241) ?Resp:  [16-19] 19 (05/04 0817) ?BP: (109-125)/(64-82) 113/73 (05/04 1241) ?SpO2:  [94 %-96 %] 94 % (05/04 0817) ?Weight:  [133.2 kg] 133.2 kg (05/04 0457) ? ?Weight change: 3.925 kg ?Filed Weights  ? 12/13/21 1742 12/15/21 0457  ?Weight: 129.3 kg 133.2 kg  ? ? ?Intake/Output: ?I/O last 3 completed shifts: ?In: 2480 [I.V.:2480] ?Out: 2450 [Urine:2450] ?  ?Intake/Output this shift: ? Total I/O ?In: 180 [P.O.:180] ?Out: 600 [Urine:600] ? ?Physical Exam: ?General: NAD, laying in bed  ?Head: Normocephalic, atraumatic. Moist oral mucosal membranes  ?Eyes: Anicteric  ?Neck: Supple  ?Lungs:  Clear to auscultation  ?Heart: Regular rate and rhythm  ?Abdomen:  Soft, nontender, obese  ?Extremities: No peripheral edema.  ?Neurologic: Nonfocal, moving all four extremities  ?Skin: No lesions  ?Access: None  ? ? ?Basic Metabolic Panel: ?Recent Labs  ?Lab 12/13/21 ?1748 12/14/21 ?0418 12/15/21 ?0540  ?NA 142 140 140  ?K 3.7 4.0 3.8  ?CL 105 107 105  ?CO2 25 26 28   ?GLUCOSE 136* 140* 161*  ?BUN 54* 44* 31*  ?CREATININE 3.52* 2.87* 2.11*  ?CALCIUM 9.3 8.5* 8.7*  ?MG 1.9  --   --   ?PHOS  --  4.3  --   ? ? ?Liver Function Tests: ?Recent Labs  ?Lab 12/13/21 ?1748 12/14/21 ?0418 12/15/21 ?0540  ?AST 26 23 22   ?ALT 21 17 16   ?ALKPHOS 46 42 40  ?BILITOT 0.7 0.7 0.2*  ?PROT 7.3 6.1* 6.5  ?ALBUMIN 3.6 3.0* 3.2*  ? ?No results for input(s): LIPASE, AMYLASE in the last 168 hours. ?No results for input(s): AMMONIA in the last 168 hours. ? ?CBC: ?Recent Labs  ?Lab 12/13/21 ?1748 12/14/21 ?0418 12/14/21 ?2139   ?WBC 8.3 7.5 6.6  ?NEUTROABS 4.8  --  3.1  ?HGB 11.1* 10.2* 11.2*  ?HCT 34.9* 32.3* 34.1*  ?MCV 94.1 94.2 93.4  ?PLT 156 142* 147*  ? ? ?Cardiac Enzymes: ?No results for input(s): CKTOTAL, CKMB, CKMBINDEX, TROPONINI in the last 168 hours. ? ?BNP: ?Invalid input(s): POCBNP ? ?CBG: ?Recent Labs  ?Lab 12/14/21 ?1132 12/14/21 ?1604 12/14/21 ?2202 12/15/21 ?0820 12/15/21 ?1241  ?GLUCAP 143* 97 139* 104* 116*  ? ? ?Microbiology: ?Results for orders placed or performed during the hospital encounter of 12/13/21  ?Respiratory (~20 pathogens) panel by PCR     Status: None  ? Collection Time: 12/14/21 11:12 AM  ? Specimen: Nasopharyngeal Swab; Respiratory  ?Result Value Ref Range Status  ? Adenovirus NOT DETECTED NOT DETECTED Final  ? Coronavirus 229E NOT DETECTED NOT DETECTED Final  ?  Comment: (NOTE) ?The Coronavirus on the Respiratory Panel, DOES NOT test for the novel  ?Coronavirus (2019 nCoV) ?  ? Coronavirus HKU1 NOT DETECTED NOT DETECTED Final  ? Coronavirus NL63 NOT DETECTED NOT DETECTED Final  ? Coronavirus OC43 NOT DETECTED NOT DETECTED Final  ? Metapneumovirus NOT DETECTED NOT DETECTED Final  ? Rhinovirus / Enterovirus NOT DETECTED NOT DETECTED Final  ? Influenza A NOT DETECTED NOT DETECTED Final  ? Influenza B NOT DETECTED NOT DETECTED Final  ? Parainfluenza Virus 1 NOT  DETECTED NOT DETECTED Final  ? Parainfluenza Virus 2 NOT DETECTED NOT DETECTED Final  ? Parainfluenza Virus 3 NOT DETECTED NOT DETECTED Final  ? Parainfluenza Virus 4 NOT DETECTED NOT DETECTED Final  ? Respiratory Syncytial Virus NOT DETECTED NOT DETECTED Final  ? Bordetella pertussis NOT DETECTED NOT DETECTED Final  ? Bordetella Parapertussis NOT DETECTED NOT DETECTED Final  ? Chlamydophila pneumoniae NOT DETECTED NOT DETECTED Final  ? Mycoplasma pneumoniae NOT DETECTED NOT DETECTED Final  ?  Comment: Performed at [facility], [address] [place], [place] [address]  ? ? ?Coagulation Studies: ?No results for input(s): LABPROT, INR in the  last 72 hours. ? ?Urinalysis: ?Recent Labs  ?  12/13/21 ?1748  ?COLORURINE STRAW*  ?LABSPEC 1.010  ?PHURINE 5.0  ?GLUCOSEU >=500*  ?HGBUR SMALL*  ?BILIRUBINUR NEGATIVE  ?KETONESUR NEGATIVE  ?PROTEINUR NEGATIVE  ?NITRITE NEGATIVE  ?LEUKOCYTESUR NEGATIVE  ?  ? ? ?Imaging: ?DG Lumbar Spine Complete ? ?Result Date: 12/15/2021 ?CLINICAL DATA:  Low back pain EXAM: LUMBAR SPINE - COMPLETE 4+ VIEW COMPARISON:  None Available. FINDINGS: 5 nonrib bearing lumbar-type vertebral bodies. Vertebral body heights are maintained. No acute fracture. Generalized osteopenia. No static listhesis. No spondylolysis. Degenerative disease with disc loss at T12-L1 and L5-S1. Bilateral facet arthropathy at L5-S1. SI joints are unremarkable. IMPRESSION: 1. Mild lumbar spine spondylosis as described above. Electronically Signed   By: Elige KoHetal  Patel M.D.   On: 12/15/2021 11:48  ? ?US Renal ? ?Result Date: 12/13/2021 ?CLINICAL DATA:  Acute kidney injury. EXAM: RENAL / URINARY TRACT ULTRASOUND COMPLETE COMPARISON:  None Available. FINDINGS: Right Kidney: Renal measurements: 12.2 cm x 5.8 cm x 4.9 cm = volume: 180 mL. Echogenicity within normal limits. No mass or hydronephrosis visualized. Left Kidney: Renal measurements: 12.3 cm x 5.2 cm x 5.4 cm = volume: 180 mL. Echogenicity within normal limits. No mass or hydronephrosis visualized. Bladder: Appears normal for degree of bladder distention. Other: None. IMPRESSION: Normal renal ultrasound. Electronically Signed   By: Aram Candelahaddeus  Houston M.D.   On: 12/13/2021 20:08   ? ? ?Medications:  ? ? lactated ringers 100 mL/hr at 12/15/21 0457  ? ? folic acid  1 mg Oral Daily  ? heparin  5,000 Units Subcutaneous Q8H  ? hydrALAZINE  10 mg Intravenous Q6H  ? insulin aspart  0-9 Units Subcutaneous TID WC  ? LORazepam  0-4 mg Oral Q6H  ? Followed by  ? [START ON 12/16/2021] LORazepam  0-4 mg Oral Q12H  ? multivitamin with minerals  1 tablet Oral Daily  ? nicotine  21 mg Transdermal Daily  ? pantoprazole (PROTONIX) IV  40  mg Intravenous Q12H  ? sodium chloride flush  3 mL Intravenous Q12H  ? thiamine  100 mg Oral Daily  ? Or  ? thiamine  100 mg Intravenous Daily  ? ?acetaminophen **OR** acetaminophen, HYDROcodone-acetaminophen, LORazepam **OR** LORazepam, methocarbamol, traMADol ? ?Assessment/ Plan:  ?Mr. Brian Conner is a 51 y.o.  male  with past medical history of diabetes and hypertension, who [facility] for AKI (acute kidney injury) (HCC) [N17.9] ? ? ?Acute kidney injury secondary to hypovolemia from recent nausea and vomiting and medications.  Renal ultrasound negative for obstruction.  No exposure to IV contrast. ?Creatinine appears to be within normal range.   ? IV fluids stopped, maintaining adequate intake.  Renal function improving with adequate urine output.  Patient cleared to discharge from renal stance, patient has appointment scheduled at our office on Tuesday, May  9. ? ?Lab Results  ?Component Value Date  ? CREATININE 2.11 (H) 12/15/2021  ? CREATININE 2.87 (H) 12/14/2021  ? CREATININE 3.52 (H) 12/13/2021  ? ? ?Intake/Output Summary (Last 24 hours) at 12/15/2021 1416 ?Last data filed at 12/15/2021 1054 ?Gross per 24 hour  ?Intake 2659.97 ml  ?Output 2250 ml  ?Net 409.97 ml  ? ? Hypertension, essential.  Home regimen includes atenolol-chlorthalidone, lisinopril.  All currently held.   ? ?Diabetes mellitus type 2.  Hemoglobin A1c 6.7 on 12-13-2021.  Glucose stable  ? ? ? LOS: 0 ?Brian Conner ?5/4/20232:16 PM ?  ?

## 2021-12-15 NOTE — Plan of Care (Signed)

## 2021-12-15 NOTE — Plan of Care (Signed)
?  Problem: Education: ?Goal: Knowledge of General Education information will improve ?Description: Including pain rating scale, medication(s)/side effects and non-pharmacologic comfort measures ?12/15/2021 1735 by Evelena Peat, RN ?Outcome: Completed/Met ?12/15/2021 0957 by Evelena Peat, RN ?Outcome: Progressing ?  ?Problem: Health Behavior/Discharge Planning: ?Goal: Ability to manage health-related needs will improve ?12/15/2021 1735 by Evelena Peat, RN ?Outcome: Completed/Met ?12/15/2021 0957 by Evelena Peat, RN ?Outcome: Progressing ?  ?Problem: Clinical Measurements: ?Goal: Ability to maintain clinical measurements within normal limits will improve ?12/15/2021 1735 by Evelena Peat, RN ?Outcome: Completed/Met ?12/15/2021 0957 by Evelena Peat, RN ?Outcome: Progressing ?Goal: Will remain free from infection ?12/15/2021 1735 by Evelena Peat, RN ?Outcome: Completed/Met ?12/15/2021 0957 by Evelena Peat, RN ?Outcome: Progressing ?Goal: Diagnostic test results will improve ?12/15/2021 1735 by Evelena Peat, RN ?Outcome: Completed/Met ?12/15/2021 0957 by Evelena Peat, RN ?Outcome: Progressing ?Goal: Respiratory complications will improve ?12/15/2021 1735 by Evelena Peat, RN ?Outcome: Completed/Met ?12/15/2021 0957 by Evelena Peat, RN ?Outcome: Progressing ?Goal: Cardiovascular complication will be avoided ?12/15/2021 1735 by Evelena Peat, RN ?Outcome: Completed/Met ?12/15/2021 0957 by Evelena Peat, RN ?Outcome: Progressing ?  ?

## 2022-02-28 ENCOUNTER — Institutional Professional Consult (permissible substitution): Admitting: Adult Health

## 2022-06-25 ENCOUNTER — Emergency Department: Payer: No Typology Code available for payment source

## 2022-06-25 ENCOUNTER — Emergency Department
Admission: EM | Admit: 2022-06-25 | Discharge: 2022-06-26 | Disposition: A | Discharge status: Home / Self Care | Payer: No Typology Code available for payment source | Attending: Emergency Medicine | Admitting: Emergency Medicine

## 2022-06-25 ENCOUNTER — Other Ambulatory Visit: Payer: Self-pay

## 2022-06-25 DIAGNOSIS — I10 Essential (primary) hypertension: Secondary | ICD-10-CM | POA: Diagnosis not present

## 2022-06-25 DIAGNOSIS — I959 Hypotension, unspecified: Secondary | ICD-10-CM | POA: Insufficient documentation

## 2022-06-25 DIAGNOSIS — Y92410 Unspecified street and highway as the place of occurrence of the external cause: Secondary | ICD-10-CM | POA: Insufficient documentation

## 2022-06-25 DIAGNOSIS — S39012A Strain of muscle, fascia and tendon of lower back, initial encounter: Secondary | ICD-10-CM | POA: Insufficient documentation

## 2022-06-25 DIAGNOSIS — S3992XA Unspecified injury of lower back, initial encounter: Secondary | ICD-10-CM | POA: Diagnosis present

## 2022-06-25 DIAGNOSIS — E119 Type 2 diabetes mellitus without complications: Secondary | ICD-10-CM | POA: Insufficient documentation

## 2022-06-25 LAB — URINALYSIS, ROUTINE W REFLEX MICROSCOPIC
Bacteria, UA: NONE SEEN
Bilirubin Urine: NEGATIVE
Glucose, UA: 50 mg/dL — AB
Hgb urine dipstick: NEGATIVE
Ketones, ur: NEGATIVE mg/dL
Leukocytes,Ua: NEGATIVE
Nitrite: NEGATIVE
Protein, ur: 30 mg/dL — AB
Specific Gravity, Urine: 1.021 (ref 1.005–1.030)
pH: 5 (ref 5.0–8.0)

## 2022-06-25 LAB — CBC
HCT: 39 % (ref 39.0–52.0)
Hemoglobin: 13.2 g/dL (ref 13.0–17.0)
MCH: 30.8 pg (ref 26.0–34.0)
MCHC: 33.8 g/dL (ref 30.0–36.0)
MCV: 91.1 fL (ref 80.0–100.0)
Platelets: 183 10*3/uL (ref 150–400)
RBC: 4.28 MIL/uL (ref 4.22–5.81)
RDW: 13.8 % (ref 11.5–15.5)
WBC: 7.5 10*3/uL (ref 4.0–10.5)
nRBC: 0 % (ref 0.0–0.2)

## 2022-06-25 LAB — COMPREHENSIVE METABOLIC PANEL
ALT: 17 U/L (ref 0–44)
AST: 29 U/L (ref 15–41)
Albumin: 3.4 g/dL — ABNORMAL LOW (ref 3.5–5.0)
Alkaline Phosphatase: 39 U/L (ref 38–126)
Anion gap: 12 (ref 5–15)
BUN: 15 mg/dL (ref 6–20)
CO2: 29 mmol/L (ref 22–32)
Calcium: 9 mg/dL (ref 8.9–10.3)
Chloride: 98 mmol/L (ref 98–111)
Creatinine, Ser: 1.75 mg/dL — ABNORMAL HIGH (ref 0.61–1.24)
GFR, Estimated: 46 mL/min — ABNORMAL LOW (ref >60)
Glucose, Bld: 137 mg/dL — ABNORMAL HIGH (ref 70–99)
Potassium: 2.4 mmol/L — CL (ref 3.5–5.1)
Sodium: 139 mmol/L (ref 135–145)
Total Bilirubin: 0.7 mg/dL (ref 0.3–1.2)
Total Protein: 6.8 g/dL (ref 6.5–8.1)

## 2022-06-25 MED ORDER — IOHEXOL 300 MG/ML  SOLN
100.0000 mL | Freq: Once | INTRAMUSCULAR | Status: AC | PRN
  Administered 2022-06-25: 100 mL via INTRAVENOUS

## 2022-06-25 MED ORDER — SODIUM CHLORIDE 0.9 % IV SOLN: Freq: Once | INTRAVENOUS | Status: AC

## 2022-06-25 MED ORDER — POTASSIUM CHLORIDE CRYS ER 20 MEQ PO TBCR
40.0000 meq | EXTENDED_RELEASE_TABLET | Freq: Once | ORAL | Status: AC
Start: 2022-06-25 — End: 2022-06-25
  Administered 2022-06-25: 40 meq via ORAL
  Filled 2022-06-25: qty 2

## 2022-06-25 MED ORDER — ACETAMINOPHEN 325 MG PO TABS
650.0000 mg | ORAL_TABLET | Freq: Once | ORAL | Status: AC
  Administered 2022-06-25: 650 mg via ORAL
  Filled 2022-06-25: qty 2
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
## 2022-06-25 NOTE — ED Notes (Signed)
Pt transported to Xray at this time.

## 2022-06-25 NOTE — ED Notes (Signed)
Pt family in room at this time.

## 2022-06-26 ENCOUNTER — Telehealth: Payer: Self-pay | Admitting: Student in an Organized Health Care Education/Training Program

## 2022-06-26 MED ORDER — LIDOCAINE 5 % EX PTCH: 1.0000 | MEDICATED_PATCH | Freq: Two times a day (BID) | CUTANEOUS | 11 refills | Status: DC

## 2022-06-26 MED ORDER — LIDOCAINE 5 % EX PTCH: 1.0000 | MEDICATED_PATCH | Freq: Two times a day (BID) | CUTANEOUS | 0 refills | Status: AC

## 2022-06-26 MED ORDER — LIDOCAINE 5 % EX PTCH: 1.0000 | MEDICATED_PATCH | Freq: Two times a day (BID) | CUTANEOUS | 11 refills | Status: AC

## 2022-06-26 NOTE — ED Provider Notes (Signed)
Fortunately this patient CT scan was negative for acute traumatic injuries. He received crystalloid IV infusions and now his blood pressure on my recheck is 130/70. I referred him to PMD for follow-up, Lidoderm patches, pain control at home and gave return precautions.  Safe for discharge at this time.     Pilar Jarvis, MD 06/26/22 [phone]

## 2022-06-26 NOTE — Telephone Encounter (Signed)
Resending lidoderm patch to pharmacy.

## 2022-06-26 NOTE — Discharge Instructions (Signed)
Take acetaminophen 650 mg and ibuprofen 400 mg every 6 hours for pain.  Take with food. Use pain patch.  Thank you for choosing [place] for your health care today!  Please see your primary doctor this week for a follow up appointment.   If you do not have a primary doctor call the following clinics to establish care:  If you have insurance:  [facility] [phone] [address] [place] [place] [address]   [facility]  [phone] [address] [place] [place] [address]   If you do not have insurance:  [facility]  [phone] [address] [place] [address]  Sometimes, in the early stages of certain disease courses it is difficult to detect in the emergency department evaluation -- so, it is important that you continue to monitor your symptoms and call your doctor right away or return to the emergency department if you develop any new or worsening symptoms.  It was my pleasure to care for you today.   Daneil Dan Modesto Charon, MD

## 2023-08-05 ENCOUNTER — Emergency Department: Payer: Medicaid Other

## 2023-08-05 ENCOUNTER — Emergency Department
Admission: EM | Admit: 2023-08-05 | Discharge: 2023-08-05 | Disposition: A | Discharge status: Home / Self Care | Payer: Medicaid Other | Attending: Emergency Medicine | Admitting: Emergency Medicine

## 2023-08-05 ENCOUNTER — Other Ambulatory Visit: Payer: Self-pay

## 2023-08-05 DIAGNOSIS — Y92009 Unspecified place in unspecified non-institutional (private) residence as the place of occurrence of the external cause: Secondary | ICD-10-CM | POA: Insufficient documentation

## 2023-08-05 DIAGNOSIS — Z7982 Long term (current) use of aspirin: Secondary | ICD-10-CM | POA: Diagnosis not present

## 2023-08-05 DIAGNOSIS — E119 Type 2 diabetes mellitus without complications: Secondary | ICD-10-CM | POA: Insufficient documentation

## 2023-08-05 DIAGNOSIS — S01311A Laceration without foreign body of right ear, initial encounter: Secondary | ICD-10-CM | POA: Diagnosis not present

## 2023-08-05 DIAGNOSIS — W010XXA Fall on same level from slipping, tripping and stumbling without subsequent striking against object, initial encounter: Secondary | ICD-10-CM | POA: Diagnosis not present

## 2023-08-05 DIAGNOSIS — S0991XA Unspecified injury of ear, initial encounter: Secondary | ICD-10-CM | POA: Diagnosis present

## 2023-08-05 DIAGNOSIS — S0990XA Unspecified injury of head, initial encounter: Secondary | ICD-10-CM | POA: Diagnosis not present

## 2023-08-05 DIAGNOSIS — I10 Essential (primary) hypertension: Secondary | ICD-10-CM | POA: Insufficient documentation

## 2023-08-05 MED ORDER — ACETAMINOPHEN 500 MG PO TABS: 1000.0000 mg | ORAL_TABLET | Freq: Once | ORAL | Status: DC

## 2023-08-05 NOTE — Discharge Instructions (Signed)
You may alternate Tylenol 1000 mg every 6 hours as needed for pain, fever and Ibuprofen 800 mg every 6-8 hours as needed for pain, fever.  Please take Ibuprofen with food.  Do not take more than 4000 mg of Tylenol (acetaminophen) in a 24 hour period.   Her head CT was normal today.

## 2023-08-05 NOTE — ED Triage Notes (Addendum)
Pt presents to ER with c/o right ear lac that happened around 2230 while pt was walking to his bed.  Pt reports he slipped and hit his ehad on the TV stand.  Pt has lac to his pinna with bleeding controlled at this time.  Pt denies LOC or taking blood thinners.  Pt is otherwise A&O x4 and in NAD at this time.    Denies ETOH use, but does appear slightly intoxicated in triage.

## 2023-08-05 NOTE — ED Provider Notes (Signed)
[facility] Provider Note    Event Date/Time   First MD Initiated Contact with Patient 08/05/23 [phone]     (approximate)   History   Ear Laceration   HPI  Brian Conner is a 52 y.o. male with history of hypertension, diabetes, hyperlipidemia who presents to the emergency department after he tripped and fell at home tonight and had a laceration to the top of the right pinna.  States his tetanus vaccine is up-to-date within the past year.  Denies any other injury.  No loss of consciousness.  No neck or back pain.  Not on blood thinners.   History provided by patient, wife.    Past Medical History:  Diagnosis Date   Diabetes mellitus without complication (HCC)    Hyperlipemia    Hypertension     Past Surgical History:  Procedure Laterality Date   CHOLECYSTECTOMY     COLONOSCOPY WITH PROPOFOL N/A 11/15/2020   Procedure: COLONOSCOPY WITH PROPOFOL;  Surgeon: Toledo, Boykin Nearing, MD;  Location: [facility] ENDOSCOPY;  Service: Gastroenterology;  Laterality: N/A;    MEDICATIONS:  Prior to Admission medications   Medication Sig Start Date End Date Taking? Authorizing Provider  aspirin EC 81 MG tablet Take 81 mg by mouth daily. Swallow whole.    [provider]  clonazePAM (KLONOPIN) 0.5 MG tablet Take 0.5-1.5 mg by mouth 2 (two) times daily as needed for anxiety.    [provider]  D3-50 1.25 MG (50000 UT) capsule Take 50,000 Units by mouth once a week. 11/14/21   [provider]  dibucaine (NUPERCAINAL) 1 % OINT Place 1 application rectally as needed for hemorrhoids. Patient not taking: Reported on 12/13/2021 01/30/18   Willy Eddy, MD  folic acid (FOLVITE) 1 MG tablet Take 1 mg by mouth daily. 09/19/21   [provider]  methocarbamol (ROBAXIN) 750 MG tablet Take 1 tablet (750 mg total) by mouth every 6 (six) hours as needed for muscle spasms. 12/15/21   Swayze, Ava, DO  Multiple Vitamin (MULTIVITAMIN WITH MINERALS) TABS tablet  Take 1 tablet by mouth daily. 12/16/21   Swayze, Ava, DO  nicotine (NICODERM CQ - DOSED IN MG/24 HOURS) 21 mg/24hr patch Place 1 patch (21 mg total) onto the skin daily. 12/16/21   Swayze, Ava, DO  pantoprazole (PROTONIX) 40 MG tablet Take 40 mg by mouth daily.    [provider]  rosuvastatin (CRESTOR) 40 MG tablet Take 40 mg by mouth daily.    [provider]  thiamine 100 MG tablet Take 1 tablet (100 mg total) by mouth daily. 12/16/21   Fran Lowes, DO    Physical Exam   Triage Vital Signs: ED Triage Vitals [08/05/23 0023]  Encounter Vitals Group     BP (!) 127/101     Systolic BP Percentile      Diastolic BP Percentile      Pulse Rate 94     Resp 14     Temp 98.1 F (36.7 C)     Temp Source Oral     SpO2 96 %     Weight 276 lb (125.2 kg)     Height 5\' 11"  (1.803 m)     Head Circumference      Peak Flow      Pain Score 8     Pain Loc      Pain Education      Exclude from Growth Chart     Most recent vital signs: Vitals:  08/05/23 0023  BP: (!) 127/101  Pulse: 94  Resp: 14  Temp: 98.1 F (36.7 C)  SpO2: 96%     CONSTITUTIONAL: Alert, responds appropriately to questions. Well-appearing; well-nourished; GCS 15 HEAD: Normocephalic; atraumatic EYES: Conjunctivae clear, PERRL, EOMI ENT: normal nose; no rhinorrhea; moist mucous membranes; pharynx without lesions noted; no dental injury; no septal hematoma, no epistaxis; no facial deformity or bony tenderness, 2 cm superficial laceration to the superior portion of the right pinna NECK: Supple, no midline spinal tenderness, step-off or deformity; trachea midline CARD: RRR; S1 and S2 appreciated; no murmurs, no clicks, no rubs, no gallops RESP: Normal chest excursion without splinting or tachypnea; breath sounds clear and equal bilaterally; no wheezes, no rhonchi, no rales; no hypoxia or respiratory distress CHEST:  chest wall stable, no crepitus or ecchymosis or deformity, nontender to palpation; no flail  chest ABD/GI: Non-distended; soft, non-tender, no rebound, no guarding; no ecchymosis or other lesions noted PELVIS:  stable, nontender to palpation BACK:  The back appears normal; no midline spinal tenderness, step-off or deformity EXT: Normal ROM in all joints; no edema; normal capillary refill; no cyanosis, no bony tenderness or bony deformity of patient's extremities, no joint effusions, compartments are soft, extremities are warm and well-perfused, no ecchymosis SKIN: Normal color for age and race; warm NEURO: No facial asymmetry, normal speech, moving all extremities equally, normal gait  ED Results / Procedures / Treatments   LABS: (all labs ordered are listed, but only abnormal results are displayed) Labs Reviewed - No data to display   EKG:  EKG Interpretation Date/Time:    Ventricular Rate:    PR Interval:    QRS Duration:    QT Interval:    QTC Calculation:   R Axis:      Text Interpretation:            RADIOLOGY: My personal review and interpretation of imaging: CT head unremarkable.  I have personally reviewed all radiology reports. CT HEAD WO CONTRAST ( ) Result Date: 08/05/2023 CLINICAL DATA:  Head trauma, moderate-severe. Fall, hit head. Right ear laceration. EXAM: CT HEAD WITHOUT CONTRAST TECHNIQUE: Contiguous axial images were obtained from the base of the skull through the vertex without intravenous contrast. RADIATION DOSE REDUCTION: This exam was performed according to the departmental dose-optimization program which includes automated exposure control, adjustment of the mA and/or kV according to patient size and/or use of iterative reconstruction technique. COMPARISON:  09/29/2010 FINDINGS: Brain: No acute intracranial abnormality. Specifically, no hemorrhage, hydrocephalus, mass lesion, acute infarction, or significant intracranial injury. Vascular: No hyperdense vessel or unexpected calcification. Skull: No acute calvarial abnormality. Sinuses/Orbits:  No acute findings Other: None IMPRESSION: No acute intracranial abnormality. Electronically Signed   By: Charlett Nose M.D.   On: 08/05/2023 00:43     PROCEDURES:  Critical Care performed: No  LACERATION REPAIR Performed by: Baxter Hire Misa Fedorko Authorized by: Baxter Hire Maliek Schellhorn Consent: Verbal consent obtained. Risks and benefits: risks, benefits and alternatives were discussed Consent given by: patient Patient identity confirmed: provided demographic data Prepped and Draped in normal sterile fashion Wound explored  Laceration Location: Right pinna  Laceration Length: 2 cm  No Foreign Bodies seen or palpated  Anesthesia: None  Amount of cleaning: standard  Skin closure: Using Dermabond  Number of sutures: 0  Technique: Wound cleaned with sterile saline.  Wound then cleaned with Betadine and draped in sterile fashion. Wound closed using Dermabond.  Good wound approximation and hemostasis achieved.    Patient tolerance: Patient tolerated the procedure  well with no immediate complications.    Procedures    IMPRESSION / MDM / ASSESSMENT AND PLAN / ED COURSE  I reviewed the triage vital signs and the nursing notes.  Patient here after head injury with right ear laceration.  Tetanus vaccine up-to-date.     DIFFERENTIAL DIAGNOSIS (includes but not limited to):   Ear laceration, intracranial hemorrhage, skull fracture, concussion  Patient's presentation is most consistent with acute presentation with potential threat to life or bodily function.  PLAN: CT head reviewed and interpreted by myself and the radiologist and shows no acute abnormality.  Wound cleaned and repaired using Dermabond.  Tetanus vaccine up-to-date.  Will give Tylenol for discomfort.  No other sign of injury on exam.  Patient is neurologically intact here.  I feel he is safe for discharge home.   MEDICATIONS GIVEN IN ED: Medications  acetaminophen (TYLENOL) tablet 1,000 mg (has no administration in time range)      ED COURSE:  At this time, I do not feel there is any life-threatening condition present. I reviewed all nursing notes, vitals, pertinent previous records.  All lab and urine results, EKGs, imaging ordered have been independently reviewed and interpreted by myself.  I reviewed all available radiology reports from any imaging ordered this visit.  Based on my assessment, I feel the patient is safe to be discharged home without further emergent workup and can continue workup as an outpatient as needed. Discussed all findings, treatment plan as well as usual and customary return precautions.  They verbalize understanding and are comfortable with this plan.  Outpatient follow-up has been provided as needed.  All questions have been answered.    CONSULTS:  none   OUTSIDE RECORDS REVIEWED: Reviewed last nephrology note in July 2023.       FINAL CLINICAL IMPRESSION(S) / ED DIAGNOSES   Final diagnoses:  Laceration of right pinna without complication, initial encounter  Injury of head, initial encounter     Rx / DC Orders   ED Discharge Orders     None        Note:  This document was prepared using Dragon voice recognition software and may include unintentional dictation errors.   Cyndi Montejano, Layla Maw, DO 08/05/23 [phone]

## 2023-08-18 IMAGING — CR DG LUMBAR SPINE COMPLETE 4+V
5 series · 5 of 5 positions shown · non-contrast
Comparison: None Available.

CLINICAL DATA: Low back pain

EXAM:
LUMBAR SPINE - COMPLETE 4+ VIEW

[l-spine ap]
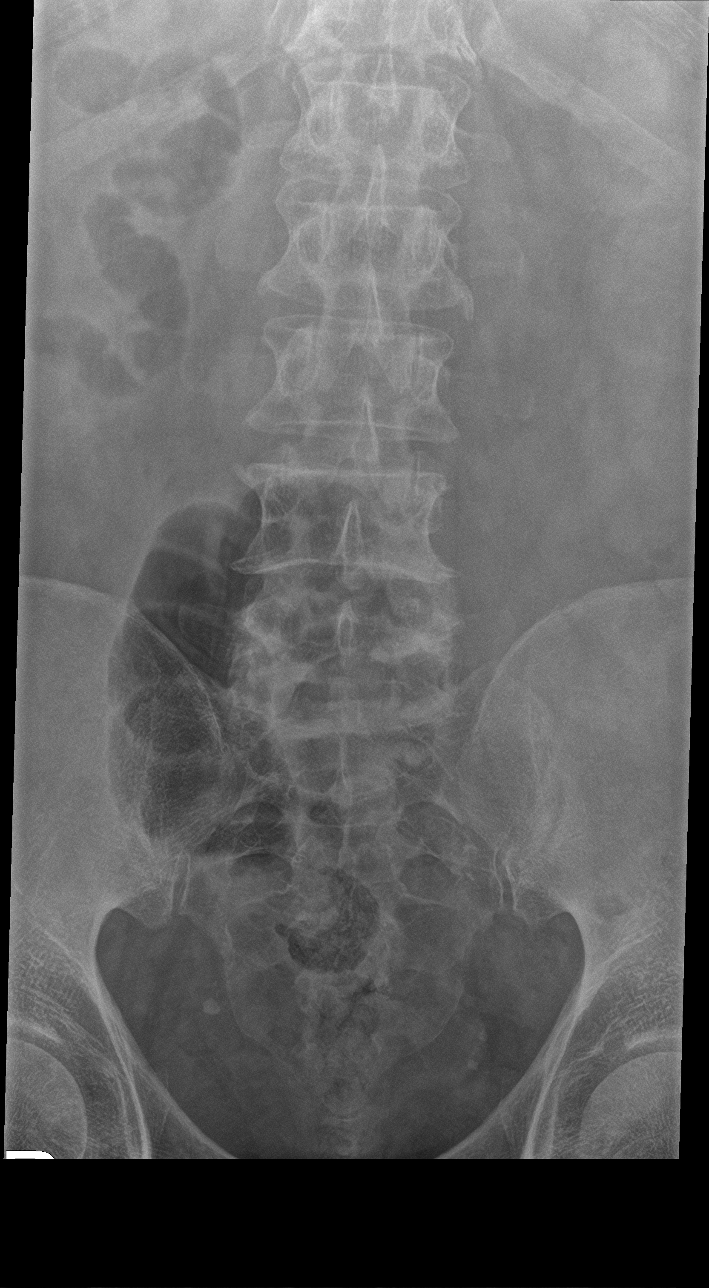

[l-spine obl (1 of 2)]
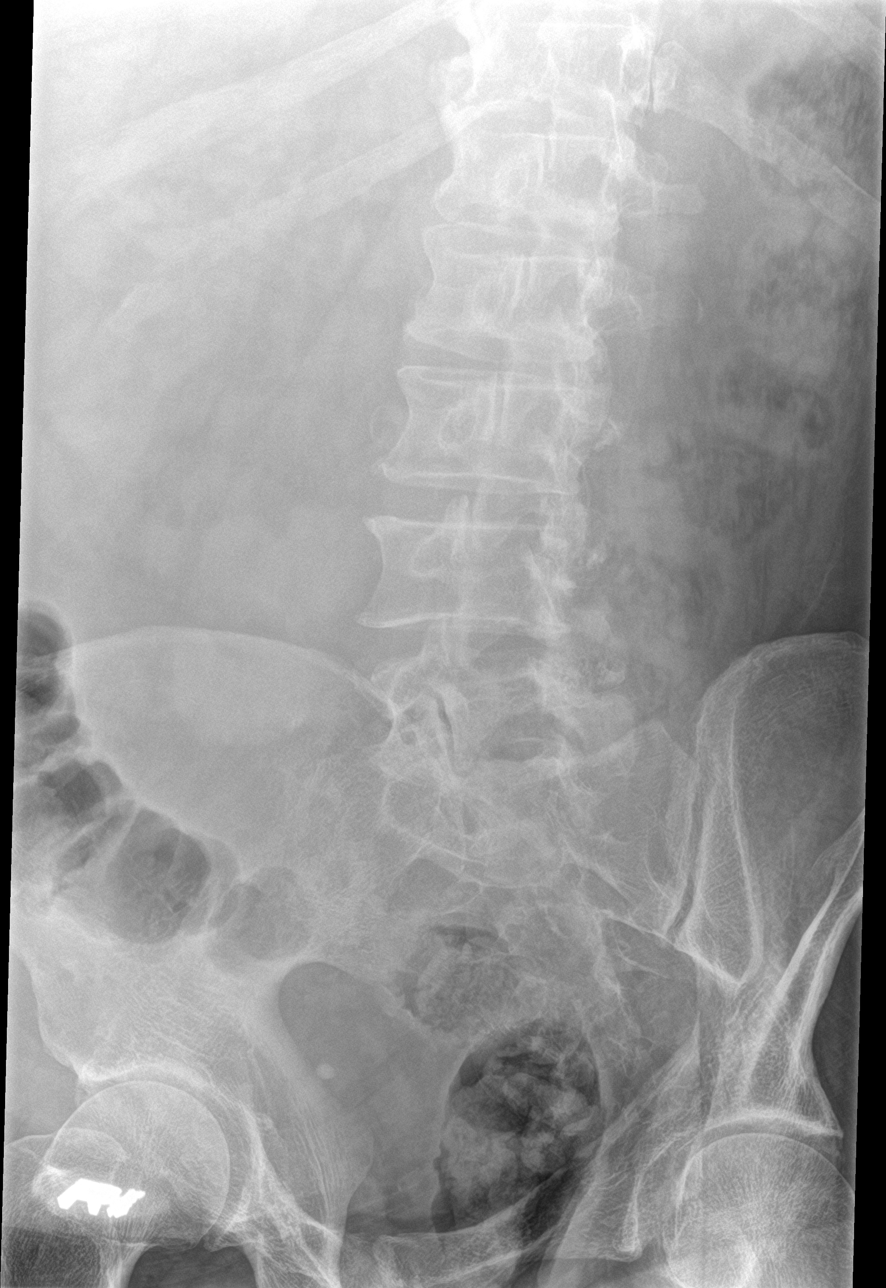

[l-spine obl (2 of 2)]
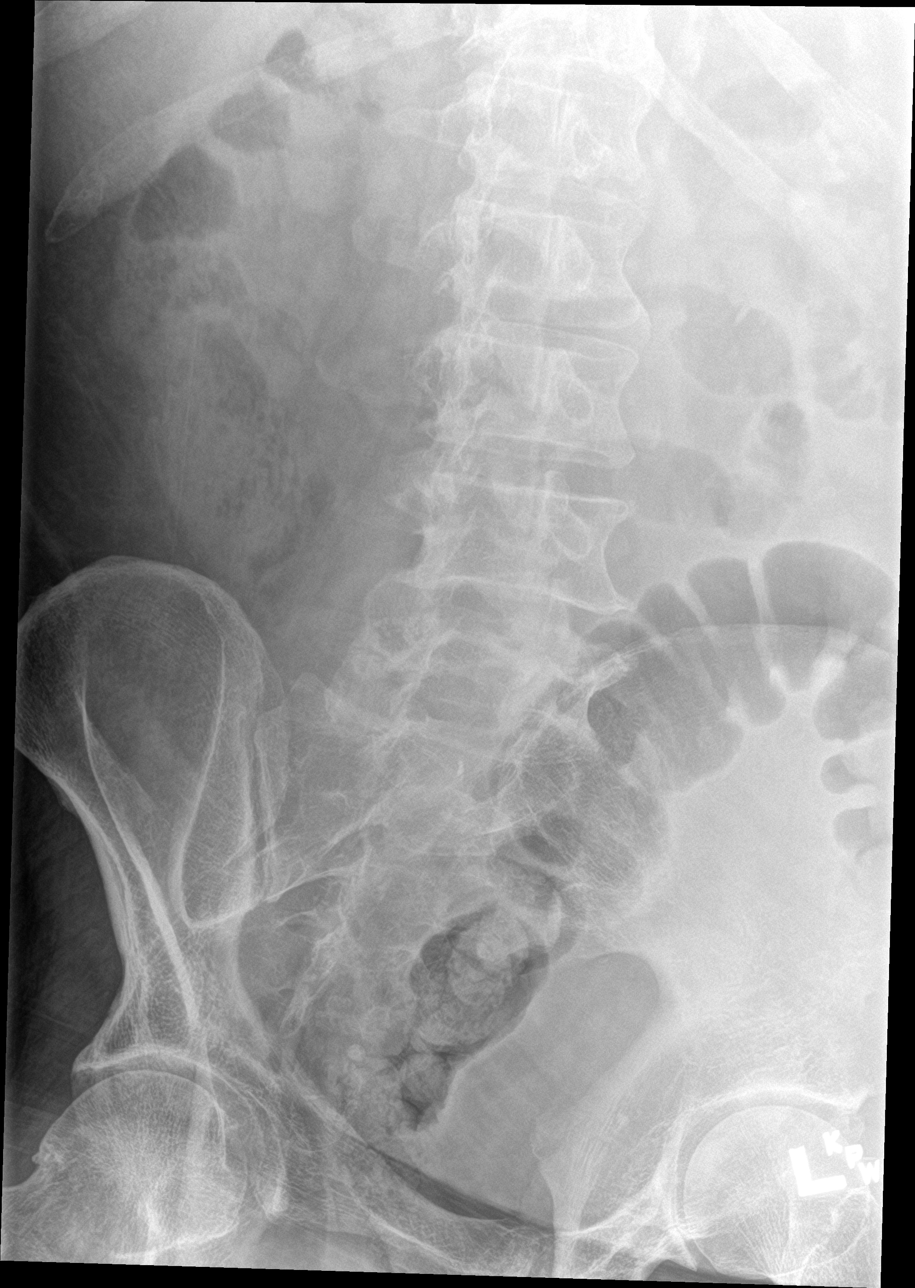

[l-spine lat]
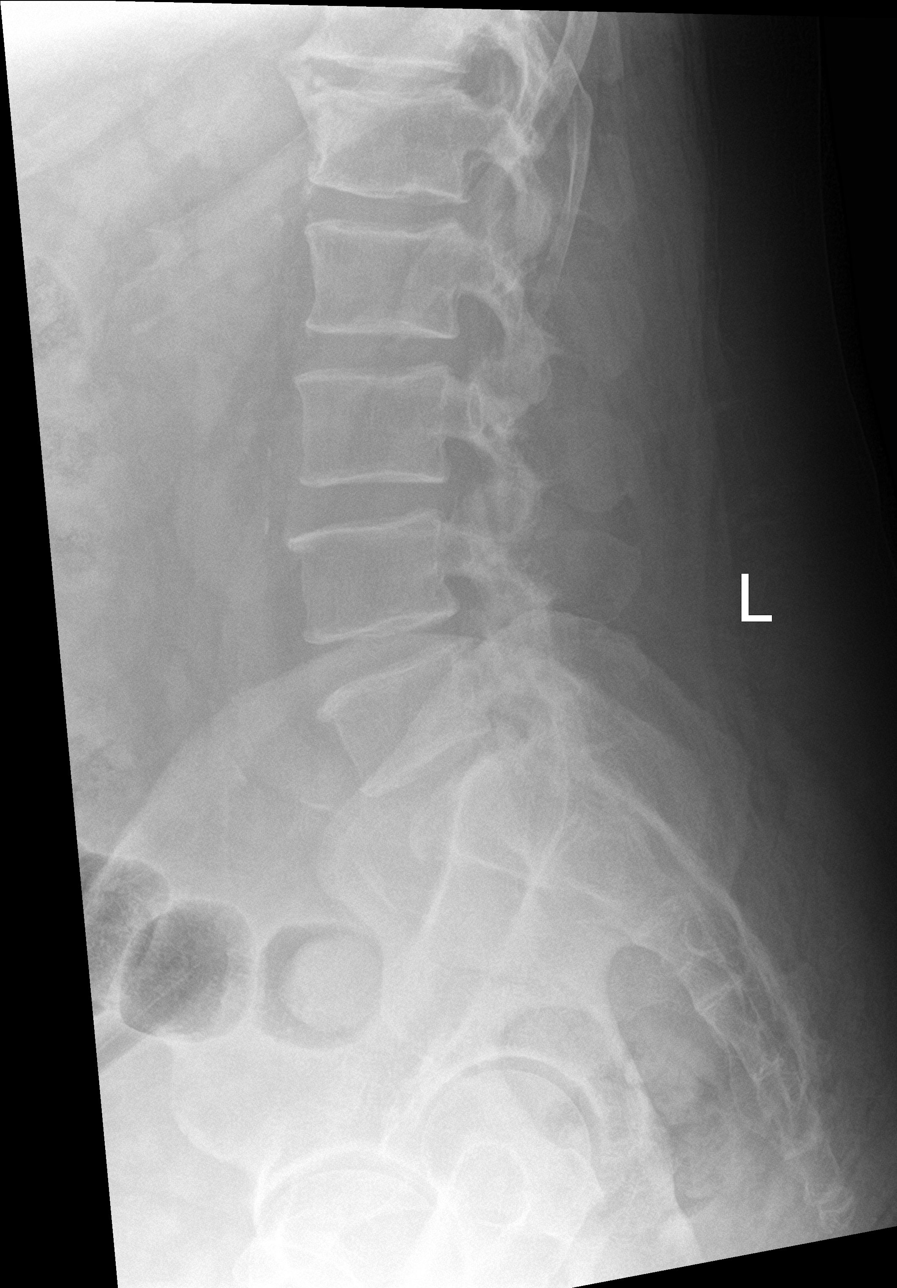

[l-spine spot]
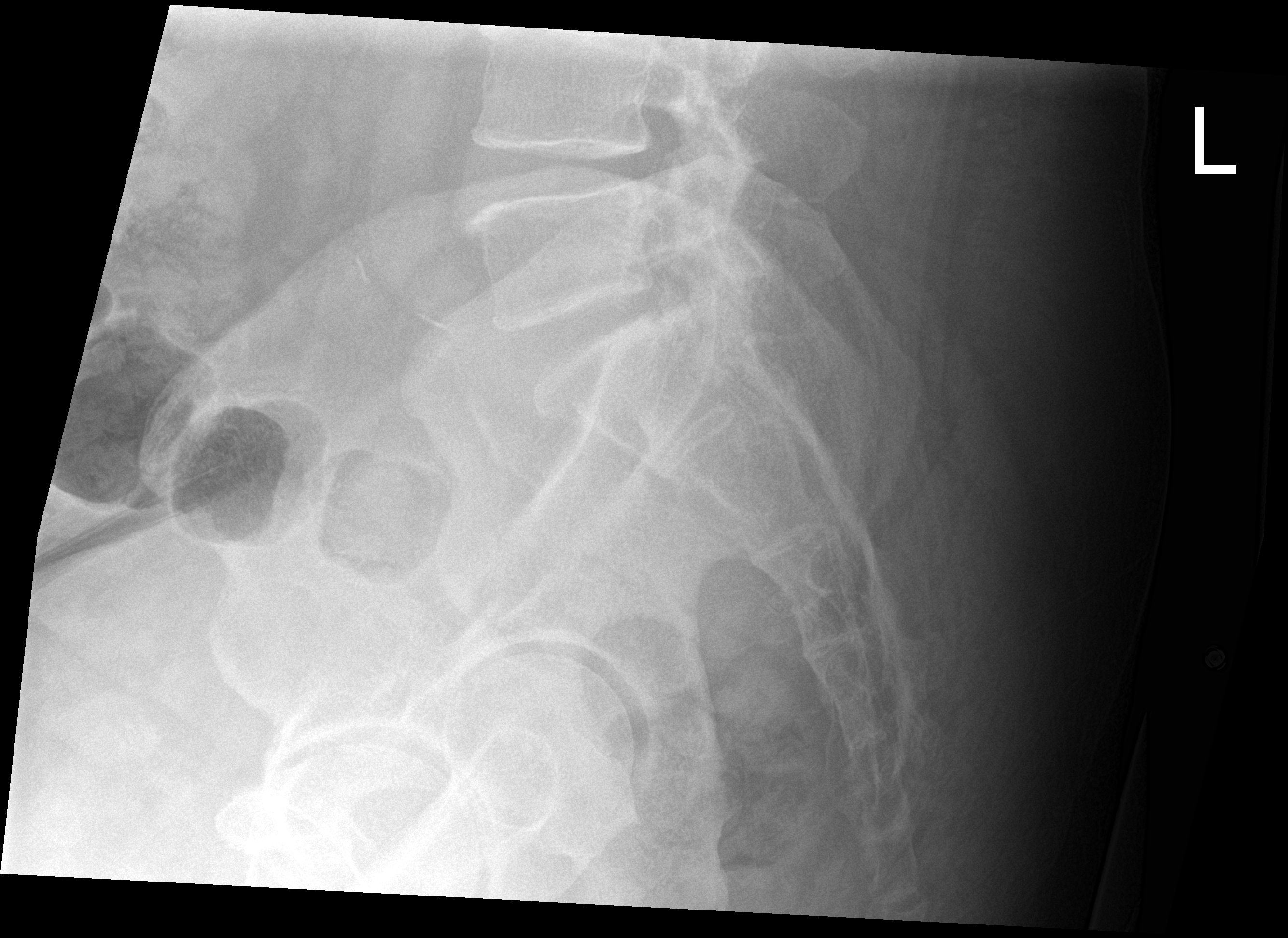

[5 of 5 positions shown; findings below may reference images not displayed]

FINDINGS: 5 nonrib bearing lumbar-type vertebral bodies.

Vertebral body heights are maintained. No acute fracture.
Generalized osteopenia.

No static listhesis. No spondylolysis.

Degenerative disease with disc loss at T12-L1 and L5-S1. Bilateral
facet arthropathy at L5-S1.

SI joints are unremarkable.
IMPRESSION: 1. Mild lumbar spine spondylosis as described above.

## 2024-07-09 ENCOUNTER — Other Ambulatory Visit: Payer: Self-pay

## 2024-07-09 ENCOUNTER — Emergency Department
Admission: EM | Admit: 2024-07-09 | Discharge: 2024-07-09 | Disposition: A | Discharge status: Home / Self Care | Attending: Emergency Medicine | Admitting: Emergency Medicine

## 2024-07-09 ENCOUNTER — Encounter: Payer: Self-pay | Admitting: Emergency Medicine

## 2024-07-09 DIAGNOSIS — M791 Myalgia, unspecified site: Secondary | ICD-10-CM | POA: Insufficient documentation

## 2024-07-09 DIAGNOSIS — F10930 Alcohol use, unspecified with withdrawal, uncomplicated: Secondary | ICD-10-CM

## 2024-07-09 DIAGNOSIS — R112 Nausea with vomiting, unspecified: Secondary | ICD-10-CM | POA: Insufficient documentation

## 2024-07-09 DIAGNOSIS — R509 Fever, unspecified: Secondary | ICD-10-CM | POA: Diagnosis not present

## 2024-07-09 DIAGNOSIS — I10 Essential (primary) hypertension: Secondary | ICD-10-CM | POA: Insufficient documentation

## 2024-07-09 DIAGNOSIS — F10230 Alcohol dependence with withdrawal, uncomplicated: Secondary | ICD-10-CM | POA: Diagnosis not present

## 2024-07-09 DIAGNOSIS — R197 Diarrhea, unspecified: Secondary | ICD-10-CM | POA: Diagnosis not present

## 2024-07-09 DIAGNOSIS — R531 Weakness: Secondary | ICD-10-CM | POA: Diagnosis not present

## 2024-07-09 DIAGNOSIS — E119 Type 2 diabetes mellitus without complications: Secondary | ICD-10-CM | POA: Diagnosis not present

## 2024-07-09 LAB — RESP PANEL BY RT-PCR (RSV, FLU A&B, COVID)  RVPGX2
Influenza A by PCR: NEGATIVE
Influenza B by PCR: NEGATIVE
Resp Syncytial Virus by PCR: NEGATIVE
SARS Coronavirus 2 by RT PCR: NEGATIVE

## 2024-07-09 LAB — CBC WITH DIFFERENTIAL/PLATELET
Abs Immature Granulocytes: 0.05 K/uL (ref 0.00–0.07)
Basophils Absolute: 0.1 K/uL (ref 0.0–0.1)
Basophils Relative: 0 %
Eosinophils Absolute: 0.1 K/uL (ref 0.0–0.5)
Eosinophils Relative: 1 %
HCT: 47.8 % (ref 39.0–52.0)
Hemoglobin: 16 g/dL (ref 13.0–17.0)
Immature Granulocytes: 0 %
Lymphocytes Relative: 12 %
Lymphs Abs: 1.5 K/uL (ref 0.7–4.0)
MCH: 30.5 pg (ref 26.0–34.0)
MCHC: 33.5 g/dL (ref 30.0–36.0)
MCV: 91.2 fL (ref 80.0–100.0)
Monocytes Absolute: 0.6 K/uL (ref 0.1–1.0)
Monocytes Relative: 5 %
Neutro Abs: 10.1 K/uL — ABNORMAL HIGH (ref 1.7–7.7)
Neutrophils Relative %: 82 %
Platelets: 177 K/uL (ref 150–400)
RBC: 5.24 MIL/uL (ref 4.22–5.81)
RDW: 13.4 % (ref 11.5–15.5)
WBC: 12.4 K/uL — ABNORMAL HIGH (ref 4.0–10.5)
nRBC: 0 % (ref 0.0–0.2)

## 2024-07-09 LAB — COMPREHENSIVE METABOLIC PANEL WITH GFR
ALT: 24 U/L (ref 0–44)
AST: 30 U/L (ref 15–41)
Albumin: 3.8 g/dL (ref 3.5–5.0)
Alkaline Phosphatase: 59 U/L (ref 38–126)
Anion gap: 13 (ref 5–15)
BUN: 12 mg/dL (ref 6–20)
CO2: 27 mmol/L (ref 22–32)
Calcium: 9.5 mg/dL (ref 8.9–10.3)
Chloride: 93 mmol/L — ABNORMAL LOW (ref 98–111)
Creatinine, Ser: 1.15 mg/dL (ref 0.61–1.24)
GFR, Estimated: >60 mL/min (ref >60)
Glucose, Bld: 184 mg/dL — ABNORMAL HIGH (ref 70–99)
Potassium: 3 mmol/L — ABNORMAL LOW (ref 3.5–5.1)
Sodium: 133 mmol/L — ABNORMAL LOW (ref 135–145)
Total Bilirubin: 1.3 mg/dL — ABNORMAL HIGH (ref 0.0–1.2)
Total Protein: 7.6 g/dL (ref 6.5–8.1)

## 2024-07-09 LAB — URINALYSIS, ROUTINE W REFLEX MICROSCOPIC
Bilirubin Urine: NEGATIVE
Glucose, UA: NEGATIVE mg/dL
Hgb urine dipstick: NEGATIVE
Ketones, ur: NEGATIVE mg/dL
Leukocytes,Ua: NEGATIVE
Nitrite: NEGATIVE
Protein, ur: 30 mg/dL — AB
Specific Gravity, Urine: 1.024 (ref 1.005–1.030)
pH: 6 (ref 5.0–8.0)

## 2024-07-09 LAB — LIPASE, BLOOD: Lipase: 30 U/L (ref 11–51)

## 2024-07-09 LAB — MAGNESIUM: Magnesium: 1.7 mg/dL (ref 1.7–2.4)

## 2024-07-09 MED ORDER — KETOROLAC TROMETHAMINE 30 MG/ML IJ SOLN
15.0000 mg | Freq: Once | INTRAMUSCULAR | Status: AC
  Administered 2024-07-09: 15 mg via INTRAVENOUS
  Filled 2024-07-09: qty 1

## 2024-07-09 MED ORDER — LACTATED RINGERS IV BOLUS
1000.0000 mL | Freq: Once | INTRAVENOUS | Status: AC
  Administered 2024-07-09: 1000 mL via INTRAVENOUS

## 2024-07-09 MED ORDER — ONDANSETRON HCL 4 MG/2ML IJ SOLN
4.0000 mg | Freq: Once | INTRAMUSCULAR | Status: AC
  Administered 2024-07-09: 4 mg via INTRAVENOUS
  Filled 2024-07-09: qty 2

## 2024-07-09 MED ORDER — DEXAMETHASONE SOD PHOSPHATE PF 10 MG/ML IJ SOLN
10.0000 mg | Freq: Once | INTRAMUSCULAR | Status: AC
Start: 2024-07-09 — End: 2024-07-09
  Administered 2024-07-09: 10 mg via INTRAVENOUS

## 2024-07-09 NOTE — Discharge Instructions (Signed)
 Please follow up with your primary care provider if your symptoms are not improving over the next few days.  Return to the ER if you need help with alcohol withdrawal.

## 2024-07-09 NOTE — ED Notes (Signed)
 Called CCMD and added pt to board

## 2024-07-09 NOTE — ED Provider Notes (Signed)
 ED ECG REPORT I, Waylon Cassis, the attending physician, personally viewed and interpreted this ECG.  Date: 07/09/2024 EKG Time: 1212 Rate: 86 Rhythm: normal sinus rhythm QRS Axis: normal Intervals: normal ST/T Wave abnormalities: Nonspecific ST abnormality Narrative Interpretation: no evidence of acute ischemia; no significant change when compared to EKG of 06/25/2022    Cassis Waylon, MD 07/09/24 1219

## 2024-07-09 NOTE — ED Triage Notes (Signed)
 Pt via POV from home. Pt c/o generalized weakness, NVD, and generalized body aches since Saturday. Reports he has been around a coworker who was sick. States he has been in bed since Saturday. Pt has a hx of cholecystectomy. Pt is A&OX4 and NAD

## 2024-07-09 NOTE — ED Notes (Signed)
 Pt placed on 2L supplemental oxygen due to sats dropping while falling asleep.

## 2024-07-09 NOTE — ED Notes (Signed)
 Provider discussed admission with pt for alcohol withdraw. Pt declining same a this time.

## 2024-07-09 NOTE — ED Provider Notes (Signed)
 [facility] Provider Note    Event Date/Time   First MD Initiated Contact with Patient 07/09/24 1032     (approximate)   History   Diarrhea   HPI  Brian Conner is a 53 y.o. male history of type 2 diabetes, hypertension, hyperlipidemia and as listed in EMR presents to the emergency department for treatment and evaluation of generalized weakness with nausea, vomiting, diarrhea, fever, and bodyaches since Saturday. Unable to tolerate food or fluids. Coworker has been sick as well.      Physical Exam    Vitals:   07/09/24 1216 07/09/24 1230  BP: 123/64 (!) 108/57  Pulse: 86 79  Resp:  17  Temp:    SpO2:  97%    General: Awake, no distress.  CV:  Good peripheral perfusion.  Resp:  Normal effort.  Abd:  No distention. Soft. Non-tender Other:     ED Results / Procedures / Treatments   Labs (all labs ordered are listed, but only abnormal results are displayed)  Labs Reviewed  COMPREHENSIVE METABOLIC PANEL WITH GFR - Abnormal; Notable for the following components:      Result Value   Sodium 133 (*)    Potassium 3.0 (*)    Chloride 93 (*)    Glucose, Bld 184 (*)    Total Bilirubin 1.3 (*)    All other components within normal limits  CBC WITH DIFFERENTIAL/PLATELET - Abnormal; Notable for the following components:   WBC 12.4 (*)    Neutro Abs 10.1 (*)    All other components within normal limits  URINALYSIS, ROUTINE W REFLEX MICROSCOPIC - Abnormal; Notable for the following components:   Color, Urine YELLOW (*)    APPearance CLEAR (*)    Protein, ur 30 (*)    Bacteria, UA RARE (*)    All other components within normal limits  RESP PANEL BY RT-PCR (RSV, FLU A&B, COVID)  RVPGX2  MAGNESIUM  LIPASE, BLOOD     EKG  Sinus tachycardia with a rate of 106 with a prolonged QT   RADIOLOGY  Image and radiology report reviewed and interpreted by me. Radiology report consistent with the same.  Not indicated  PROCEDURES:  Critical  Care performed: No  Procedures   MEDICATIONS ORDERED IN ED:  Medications  lactated ringers  bolus 1,000 mL (0 mLs Intravenous Stopped 07/09/24 1207)  ondansetron  (ZOFRAN ) injection 4 mg (4 mg Intravenous Given 07/09/24 1102)  ketorolac  (TORADOL ) 30 MG/ML injection 15 mg (15 mg Intravenous Given 07/09/24 1113)  dexamethasone  (DECADRON ) injection 10 mg (10 mg Intravenous Given 07/09/24 1248)     IMPRESSION / MDM / ASSESSMENT AND PLAN / ED COURSE   I have reviewed the triage note and vital signs. Vital signs are stable   Differential diagnosis includes, but is not limited to, COVID, influenza, gastroenteritis  Patient's presentation is most consistent with acute presentation with potential threat to life or bodily function.  53 year old male presenting to the emergency department for treatment and evaluation of 4 days of nausea, vomiting, diarrhea, fever and body aches.  Labs show glucose of 184 with a relative sodium of 133.  Potassium is 3.0.  Chloride is slightly low as well.  T. bili is 1.3.  Mild leukocytosis of 12.4 with a neutrophil count of 10.1.  Magnesium is 1.7 and lipase is 30.  Urinalysis shows protein and rare bacteria.  EKG shows a prolonged QT. Patient placed on cardiac monitor. Fluids infusing.   Repeat EKG normal after fluids.  He had an episode of diaphoresis. After further discussion, patient reports that he is a daily drinker but due to nausea, vomiting, and diarrhea has been unable to do so since Monday.  He reports that the nausea has resolved.  I offered him admission for alcohol withdrawal however the patient would like to go home.  He states that he has been to rehab twice and cannot be out of work for that long.  We discussed potential risks of withdrawal and ER return precautions discussed.  He is now tolerating water.  Because of his prolonged QT segment earlier, I hesitate to prescribe nausea medication therefore a single dose of dexamethasone  ordered.  He  was advised to start with clear liquids and progressed to his normal diet as tolerated.      FINAL CLINICAL IMPRESSION(S) / ED DIAGNOSES   Final diagnoses:  Nausea vomiting and diarrhea  Alcohol withdrawal syndrome without complication (HCC)     Rx / DC Orders   ED Discharge Orders     None        Note:  This document was prepared using Dragon voice recognition software and may include unintentional dictation errors.   Herlinda Kirk NOVAK, FNP 07/09/24 1255    Claudene Rover, MD 07/09/24 [phone]
